# Patient Record
Sex: Female | Born: 1955 | Race: White | Hispanic: No | Marital: Married | Smoking: Never smoker
Health system: Southern US, Community
[De-identification: ages and names within clinical notes are randomized; demographics above are authoritative.]

## PROBLEM LIST (undated history)

## (undated) DIAGNOSIS — T7840XA Allergy, unspecified, initial encounter: Secondary | ICD-10-CM

## (undated) DIAGNOSIS — M810 Age-related osteoporosis without current pathological fracture: Secondary | ICD-10-CM

## (undated) HISTORY — PX: ABDOMINAL HYSTERECTOMY: SHX81

## (undated) HISTORY — DX: Allergy, unspecified, initial encounter: T78.40XA

## (undated) HISTORY — DX: Age-related osteoporosis without current pathological fracture: M81.0

## (undated) HISTORY — PX: COSMETIC SURGERY: SHX468

---

## 1999-01-20 ENCOUNTER — Other Ambulatory Visit: Admission: RE | Admit: 1999-01-20 | Discharge: 1999-01-20 | Payer: Self-pay | Admitting: *Deleted

## 2009-06-07 LAB — HM COLONOSCOPY

## 2011-05-06 ENCOUNTER — Encounter: Payer: Self-pay | Admitting: Family Medicine

## 2011-05-06 ENCOUNTER — Ambulatory Visit (INDEPENDENT_AMBULATORY_CARE_PROVIDER_SITE_OTHER): Payer: BC Managed Care – PPO | Admitting: Family Medicine

## 2011-05-06 DIAGNOSIS — G43909 Migraine, unspecified, not intractable, without status migrainosus: Secondary | ICD-10-CM | POA: Insufficient documentation

## 2011-05-06 MED ORDER — SUMATRIPTAN SUCCINATE 100 MG PO TABS: ORAL_TABLET | ORAL | Status: DC

## 2011-05-06 NOTE — Assessment & Plan Note (Signed)
Pt's sxs not consistent w/ cluster HA but are typical of migraine.  Pt has had relief from imitrex- will give script w/ instructions on use.  Reviewed supportive care and red flags that should prompt return.  Pt expressed understanding and is in agreement w/ plan.

## 2011-05-06 NOTE — Progress Notes (Signed)
  Subjective:    Patient ID: Sandra Mora, female    DOB: 1956/04/07, 55 y.o.   MRN: [id number]  HPI HAs- pt reports she has had HAs her 'whole life'.  She always thought they were allergy related.  When she was pregnant she had true migraines w/ auras but these weren't tx'd due to pregnancy.  After pregnancy took imitrex prn.  Had few problems since moving from [place].  Since January has had 3 episodes of 4 day HAs 'it's always 4 days'.  'nothing touches it'.  Took a friend's imitrex w/ relief.  sxs are unilateral, has associated nausea, photophobia.  No phonophobia.  Denies nasal congestion, red, runny nose, eye tearing.   Review of Systems For ROS see HPI     Objective:   Physical Exam  Constitutional: She is oriented to person, place, and time. She appears well-developed and well-nourished. No distress.  HENT:  Head: Normocephalic and atraumatic.  Eyes: Conjunctivae and EOM are normal.       R pupil doesn't constrict appropriately (old problem per pt)  Neck: Normal range of motion. Neck supple. No thyromegaly present.  Cardiovascular: Normal rate, regular rhythm, normal heart sounds and intact distal pulses.   No murmur heard. Pulmonary/Chest: Effort normal and breath sounds normal. No respiratory distress. She has no wheezes.  Lymphadenopathy:    She has no cervical adenopathy.  Neurological: She is alert and oriented to person, place, and time. She has normal reflexes. No cranial nerve deficit. Coordination normal.          Assessment & Plan:

## 2011-05-06 NOTE — Patient Instructions (Signed)
Your symptoms sound like a migraine Take the Imitrex (1/2 tab) at onset of migraine.  May repeat in 2 hrs if needed.  No more than 2 tabs in 24 hrs. Make sure you stay well hydrated Call with any questions or concerns Hang in there!

## 2012-05-12 ENCOUNTER — Other Ambulatory Visit: Payer: Self-pay | Admitting: Family Medicine

## 2012-05-12 NOTE — Telephone Encounter (Signed)
Pt left msg on triage vmail requesting a refill for sumatriptan 100mg .

## 2012-05-12 NOTE — Telephone Encounter (Signed)
refill Sumatriptan succ 100mg  tablet #15, wt-2-refills Take at onset of migraine. May repeat in 2-hours as needed Last fill 6.13.12, last ov 6.13.12

## 2012-05-13 MED ORDER — SUMATRIPTAN SUCCINATE 100 MG PO TABS: ORAL_TABLET | ORAL | Status: DC

## 2012-05-13 NOTE — Telephone Encounter (Signed)
Ok for #15, needs to schedule appt

## 2012-05-13 NOTE — Telephone Encounter (Signed)
Noted pt has not been seen in a year, please advise if the pt can have refll per medication

## 2012-05-13 NOTE — Telephone Encounter (Signed)
rx sent to pharmacy by e-script Letter has been mailed to pt address noted in the chart to advise they are overdue for cpe/ov/labs and the pt needs to contact office to set up appt   

## 2012-05-23 ENCOUNTER — Telehealth: Payer: Self-pay | Admitting: *Deleted

## 2012-05-23 NOTE — Telephone Encounter (Signed)
Pt left vm stating that [facility] T[facility] requested her to have a CPE with labs per noted a letter she received per MD Tabori sent note to this nurse per refill request on 05-12-12 to advise pt needs apt, pt vm noted that she had a CPE with labs drawn at her GYN office as well as a Bone Density test set up, pt wanted to advise so she will not have to come into [facility] office for a CPE now and we can contact the GYN office at [phone] to get a copy of the labs, please advise

## 2012-05-23 NOTE — Telephone Encounter (Signed)
Yes, we will need a copy of her labs and unless the GYN listened to heart/lungs and did complete physical exam she will still need to come in.

## 2012-05-23 NOTE — Telephone Encounter (Signed)
Called pt at home however the number is disconnected, called spouse whom advised pt mobile number [phone], added number to chart, called pt to advise [facility] instructions, pt not sure if the GYN listened to her heart or not, advised to the pt that CPE is more than just labs, and that [facility] protocol to have a EKG on file for each pt over 76yrs old for a base line to go by, pt understood and will have the GYN office fax her labs to our office, pt requested a CPE later in the afternoon per helps with the school system and earliest CPE noted after school starts, pt then accepted CPE apt 07-27-12 at 3:30pm, pt understood

## 2012-06-09 ENCOUNTER — Telehealth: Payer: Self-pay | Admitting: *Deleted

## 2012-06-09 NOTE — Telephone Encounter (Signed)
Noted incoming fax stating pt had recent pap on 04-04-12, noted already updated in chart, sent to be scanned into chart

## 2012-07-27 ENCOUNTER — Encounter: Payer: Self-pay | Admitting: Family Medicine

## 2012-07-27 ENCOUNTER — Ambulatory Visit (INDEPENDENT_AMBULATORY_CARE_PROVIDER_SITE_OTHER): Payer: BC Managed Care – PPO | Admitting: Family Medicine

## 2012-07-27 VITALS — BP 121/73 | HR 86 | Temp 98.4°F | Ht 61.25 in | Wt 133.2 lb

## 2012-07-27 DIAGNOSIS — Z0000 Encounter for general adult medical examination without abnormal findings: Secondary | ICD-10-CM

## 2012-07-27 MED ORDER — SUMATRIPTAN SUCCINATE 100 MG PO TABS: ORAL_TABLET | ORAL | Status: DC

## 2012-07-27 NOTE — Progress Notes (Signed)
  Subjective:    Patient ID: Sandra Mora, female    DOB: 1956/02/27, 56 y.o.   MRN: [id number]  HPI CPE- [facility], UTD on colonoscopy, mammo, DEXA, pap.  No concerns today.   Review of Systems Patient reports no vision/ hearing changes, adenopathy,fever, weight change,  persistant/recurrent hoarseness , swallowing issues, chest pain, palpitations, edema, persistant/recurrent cough, hemoptysis, dyspnea (rest/exertional/paroxysmal nocturnal), gastrointestinal bleeding (melena, rectal bleeding), abdominal pain, significant heartburn, bowel changes, GU symptoms (dysuria, hematuria, incontinence), Gyn symptoms (abnormal  bleeding, pain),  syncope, focal weakness, memory loss, numbness & tingling, skin/hair/nail changes, abnormal bruising or bleeding, anxiety, or depression.     Objective:   Physical Exam General Appearance:    Alert, cooperative, no distress, appears stated age  Head:    Normocephalic, without obvious abnormality, atraumatic  Eyes:    PERRL, conjunctiva/corneas clear, EOM's intact, fundi    benign, both eyes  Ears:    Normal TM's and external ear canals, both ears  Nose:   Nares normal, septum midline, mucosa normal, no drainage    or sinus tenderness  Throat:   Lips, mucosa, and tongue normal; teeth and gums normal  Neck:   Supple, symmetrical, trachea midline, no adenopathy;    Thyroid: no enlargement/tenderness/nodules  Back:     Symmetric, no curvature, ROM normal, no CVA tenderness  Lungs:     Clear to auscultation bilaterally, respirations unlabored  Chest Wall:    No tenderness or deformity   Heart:    Regular rate and rhythm, S1 and S2 normal, no murmur, rub   or gallop  Breast Exam:    Deferred to GYN  Abdomen:     Soft, non-tender, bowel sounds active all four quadrants,    no masses, no organomegaly  Genitalia:    Deferred to GYN  Rectal:    Extremities:   Extremities normal, atraumatic, no cyanosis or edema  Pulses:   2+ and symmetric all  extremities  Skin:   Skin color, texture, turgor normal, no rashes or lesions  Lymph nodes:   Cervical, supraclavicular, and axillary nodes normal  Neurologic:   CNII-XII intact, normal strength, sensation and reflexes    throughout          Assessment & Plan:

## 2012-07-27 NOTE — Assessment & Plan Note (Signed)
Pt's CPE WNL.  UTD on health maintenance.  Reviewed labs done at GYN.  Anticipatory guidance provided.

## 2012-07-27 NOTE — Patient Instructions (Addendum)
Follow up in 1 year or as needed Keep up the good work!  You look great! Call with any questions or concerns Happy Early Birthday!! 

## 2012-08-26 ENCOUNTER — Other Ambulatory Visit: Payer: Self-pay

## 2012-08-26 MED ORDER — SUMATRIPTAN SUCCINATE 100 MG PO TABS: ORAL_TABLET | ORAL | Status: DC

## 2013-07-18 ENCOUNTER — Telehealth: Payer: Self-pay | Admitting: *Deleted

## 2013-07-18 NOTE — Telephone Encounter (Signed)
Prior Auth was needed for sumatriptan however patients insurance would only approve the medication for # 9 per month.  If there is a need for an appeal the case no# is [id number].  Ag cma

## 2014-07-18 ENCOUNTER — Other Ambulatory Visit: Payer: Self-pay | Admitting: Family Medicine

## 2014-07-18 NOTE — Telephone Encounter (Signed)
Med filled.  

## 2014-08-06 ENCOUNTER — Encounter: Payer: Self-pay | Admitting: Medical

## 2014-08-06 ENCOUNTER — Ambulatory Visit (INDEPENDENT_AMBULATORY_CARE_PROVIDER_SITE_OTHER): Payer: BC Managed Care – PPO | Admitting: Medical

## 2014-08-06 ENCOUNTER — Telehealth: Payer: Self-pay

## 2014-08-06 ENCOUNTER — Ambulatory Visit: Payer: BC Managed Care – PPO | Admitting: Physician Assistant

## 2014-08-06 VITALS — BP 132/80 | HR 72 | Temp 98.5°F | Ht 62.0 in | Wt 136.0 lb

## 2014-08-06 DIAGNOSIS — J309 Allergic rhinitis, unspecified: Secondary | ICD-10-CM

## 2014-08-06 DIAGNOSIS — T148 Other injury of unspecified body region: Secondary | ICD-10-CM

## 2014-08-06 DIAGNOSIS — W57XXXA Bitten or stung by nonvenomous insect and other nonvenomous arthropods, initial encounter: Secondary | ICD-10-CM | POA: Insufficient documentation

## 2014-08-06 DIAGNOSIS — J209 Acute bronchitis, unspecified: Secondary | ICD-10-CM

## 2014-08-06 MED ORDER — ALBUTEROL SULFATE HFA 108 (90 BASE) MCG/ACT IN AERS: 2.0000 | INHALATION_SPRAY | Freq: Four times a day (QID) | RESPIRATORY_TRACT | Status: DC | PRN

## 2014-08-06 MED ORDER — FLUCONAZOLE 150 MG PO TABS: 150.0000 mg | ORAL_TABLET | Freq: Once | ORAL | Status: DC

## 2014-08-06 MED ORDER — BENZONATATE 100 MG PO CAPS: 100.0000 mg | ORAL_CAPSULE | Freq: Three times a day (TID) | ORAL | Status: DC | PRN

## 2014-08-06 MED ORDER — DOXYCYCLINE HYCLATE 100 MG PO TABS: 100.0000 mg | ORAL_TABLET | Freq: Two times a day (BID) | ORAL | Status: DC

## 2014-08-06 MED ORDER — SUMATRIPTAN SUCCINATE 50 MG PO TABS: 50.0000 mg | ORAL_TABLET | ORAL | Status: DC | PRN

## 2014-08-06 NOTE — Assessment & Plan Note (Signed)
All of patient's symptoms seem to have started off as mostly allergic. I advised her to get an over-the-counter antihistamine and to continue her steroid nasal spray.

## 2014-08-06 NOTE — Assessment & Plan Note (Signed)
Patient's last migraine was 3 weeks ago. Describes classic type and no worrisome features. She mention this at the end of the interview. She requests refill of Imitrex. So I sent in a prescription.

## 2014-08-06 NOTE — Telephone Encounter (Signed)
Called in Imitrex 50 mg 1 tablet at onset of headache. Repeat in 2 hours if headache persists. 2 tablet maximum in a 24 hour period #10 with 0 refills. To [facility] [place], [place]. [address] Per E. Jamesetta Orleans

## 2014-08-06 NOTE — Patient Instructions (Addendum)
You appear to have had some allergic rhinitis initially. I want you to take antihistamine otc and continue your steroid nasal spray. With your duration of illness lasting 2 wks you may now have some bronchitis as well so I am prescribing doxycycline antibiotic. This antibiotic  would be treatment for rmsf and lyme since you report 2 tick bites in past 3 weeks. If you get any wheezing I am making proair inhaler available. Follow up in 7 days any persisting symptoms or as needed.  At the end of exam as leaving she requested refill of her imitrix.

## 2014-08-06 NOTE — Progress Notes (Signed)
   Subjective:    Patient ID: Sandra Mora, female    DOB: 1956-10-05, 58 y.o.   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 She states about 2 wks when her symptoms started. Started as st and nasal congestion. Hx of allergic rhinitis in the past. Pt states gradually feeling worse and occasionally bring up some mucous. Feeing some fatigue. Pt denies any sinus congestion. Feeling little more winded today than usual. But no wheezing. On and off chills and warmth..   Then also states also picked 2 ticks off her. 1st tick was 3 wks ago. 2nds tick was 2 wks ago.   Review of Systems  Constitutional: Positive for fever, chills and fatigue.       Mild fevers, and mild chills. With some fatigue.  HENT: Positive for congestion, postnasal drip, rhinorrhea and sinus pressure. Negative for nosebleeds, sneezing, sore throat, trouble swallowing and voice change.   Respiratory: Positive for cough, shortness of breath and wheezing. Negative for chest tightness.   Cardiovascular: Negative for chest pain and palpitations.  Gastrointestinal: Negative for nausea, vomiting, abdominal pain, diarrhea, constipation, anal bleeding and rectal pain.  Genitourinary: Negative.   Musculoskeletal: Negative for back pain.  Skin:       One on back of her lt knee. And other right medial thigh.  Neurological: Negative for dizziness, seizures, syncope, speech difficulty, weakness, light-headedness, numbness and headaches.  Hematological: Negative for adenopathy. Does not bruise/bleed easily.       Objective:   Physical Exam  General  Mental Status - Alert. General Appearance - Well groomed. Not in acute distress.  Skin Rashes- No Rashes.  HEENT Head- Normal. Ear Auditory Canal - Left- Normal. Right - Normal.Tympanic Membrane- Left- Normal. Right- Normal. Eye Sclera/Conjunctiva- Left- Normal. Right- Normal. Nose & Sinuses Nasal Mucosa- Left-  Boggy + Congested. Right-  Boggy + Congested. No maxillary sinus pressure or frontal sinus pressure presently. Mouth & Throat Lips: Upper Lip- Normal: no dryness, cracking, pallor, cyanosis, or vesicular eruption. Lower Lip-Normal: no dryness, cracking, pallor, cyanosis or vesicular eruption. Buccal Mucosa- Bilateral- No Aphthous ulcers. Oropharynx- No Discharge or Erythema. Tonsils: Characteristics- Bilateral- No Erythema or Congestion. Size/Enlargement- Bilateral- No enlargement. Discharge- bilateral-None.  Neck Neck- Supple. No Masses.   Chest and Lung Exam Auscultation: Breath Sounds:-Normal, clear even and unlabored  Cardiovascular Auscultation:Rythm- Regular, rate and rhythm.  Murmurs & Other Heart Sounds:Ausculatation of the heart reveal- No Murmurs.  Lymphatic Head & Neck General Head & Neck Lymphatics: Bilateral: Description- No Localized lymphadenopathy.         Assessment & Plan:

## 2014-08-06 NOTE — Assessment & Plan Note (Signed)
2 tick bites within a three-week period. Patient declines any laboratory workup for 2 bites. She is agreeable to starting the doxycycline.

## 2014-08-06 NOTE — Assessment & Plan Note (Addendum)
After 2 weeks she appears to now have possible bronchitis as she is describing some productive cough. In addition she has a history of the tick bites so doxycycline may be beneficial for both bronchitis and the tick bites. So I did prescribe her antibiotic.  Note today she reported some mild shortness of breath on climbing stairs. So I did call in an inhaler. Note on physical exam today she did not have any Homan's sign.

## 2015-03-24 ENCOUNTER — Other Ambulatory Visit: Payer: Self-pay | Admitting: Medical

## 2015-03-25 NOTE — Telephone Encounter (Signed)
Called patient left message for call back.  

## 2015-08-24 LAB — HM PAP SMEAR

## 2015-08-24 LAB — HM MAMMOGRAPHY: HM Mammogram: NORMAL (ref 0–4)

## 2015-09-11 ENCOUNTER — Other Ambulatory Visit (HOSPITAL_COMMUNITY): Payer: Self-pay | Admitting: *Deleted

## 2015-09-12 ENCOUNTER — Encounter (HOSPITAL_COMMUNITY)
Admission: RE | Admit: 2015-09-12 | Discharge: 2015-09-12 | Disposition: A | Discharge status: Home / Self Care | Payer: BC Managed Care – PPO | Source: Ambulatory Visit | Attending: Obstetrics and Gynecology | Admitting: Obstetrics and Gynecology

## 2015-09-12 DIAGNOSIS — M810 Age-related osteoporosis without current pathological fracture: Secondary | ICD-10-CM | POA: Diagnosis not present

## 2015-09-12 MED ORDER — ZOLEDRONIC ACID 5 MG/100ML IV SOLN
INTRAVENOUS | Status: AC
  Filled 2015-09-12: qty 100

## 2015-09-12 MED ORDER — ZOLEDRONIC ACID 5 MG/100ML IV SOLN
5.0000 mg | Freq: Once | INTRAVENOUS | Status: AC
  Administered 2015-09-12: 5 mg via INTRAVENOUS

## 2015-09-12 NOTE — Discharge Instructions (Signed)

## 2015-09-19 ENCOUNTER — Other Ambulatory Visit: Payer: Self-pay | Admitting: Medical

## 2015-10-28 ENCOUNTER — Emergency Department (HOSPITAL_BASED_OUTPATIENT_CLINIC_OR_DEPARTMENT_OTHER)
Admission: EM | Admit: 2015-10-28 | Discharge: 2015-10-29 | Disposition: A | Discharge status: Home / Self Care | Payer: BC Managed Care – PPO | Attending: Emergency Medicine | Admitting: Emergency Medicine

## 2015-10-28 ENCOUNTER — Emergency Department (HOSPITAL_BASED_OUTPATIENT_CLINIC_OR_DEPARTMENT_OTHER): Payer: BC Managed Care – PPO

## 2015-10-28 ENCOUNTER — Encounter (HOSPITAL_BASED_OUTPATIENT_CLINIC_OR_DEPARTMENT_OTHER): Payer: Self-pay | Admitting: *Deleted

## 2015-10-28 DIAGNOSIS — W108XXA Fall (on) (from) other stairs and steps, initial encounter: Secondary | ICD-10-CM | POA: Insufficient documentation

## 2015-10-28 DIAGNOSIS — Z792 Long term (current) use of antibiotics: Secondary | ICD-10-CM | POA: Insufficient documentation

## 2015-10-28 DIAGNOSIS — S82842A Displaced bimalleolar fracture of left lower leg, initial encounter for closed fracture: Secondary | ICD-10-CM

## 2015-10-28 DIAGNOSIS — Z79899 Other long term (current) drug therapy: Secondary | ICD-10-CM | POA: Diagnosis not present

## 2015-10-28 DIAGNOSIS — Y9389 Activity, other specified: Secondary | ICD-10-CM | POA: Diagnosis not present

## 2015-10-28 DIAGNOSIS — Y9289 Other specified places as the place of occurrence of the external cause: Secondary | ICD-10-CM | POA: Diagnosis not present

## 2015-10-28 DIAGNOSIS — G43909 Migraine, unspecified, not intractable, without status migrainosus: Secondary | ICD-10-CM | POA: Diagnosis not present

## 2015-10-28 DIAGNOSIS — Y998 Other external cause status: Secondary | ICD-10-CM | POA: Diagnosis not present

## 2015-10-28 DIAGNOSIS — S9031XA Contusion of right foot, initial encounter: Secondary | ICD-10-CM

## 2015-10-28 DIAGNOSIS — Z7982 Long term (current) use of aspirin: Secondary | ICD-10-CM | POA: Diagnosis not present

## 2015-10-28 DIAGNOSIS — S82832A Other fracture of upper and lower end of left fibula, initial encounter for closed fracture: Secondary | ICD-10-CM | POA: Diagnosis not present

## 2015-10-28 DIAGNOSIS — S99921A Unspecified injury of right foot, initial encounter: Secondary | ICD-10-CM | POA: Diagnosis present

## 2015-10-28 MED ORDER — HYDROCODONE-ACETAMINOPHEN 5-325 MG PO TABS: 1.0000 | ORAL_TABLET | Freq: Four times a day (QID) | ORAL | Status: DC | PRN

## 2015-10-28 MED ORDER — HYDROCODONE-ACETAMINOPHEN 5-325 MG PO TABS
1.0000 | ORAL_TABLET | Freq: Once | ORAL | Status: AC
  Administered 2015-10-28: 1 via ORAL
  Filled 2015-10-28: qty 1

## 2015-10-28 NOTE — Discharge Instructions (Signed)
Ankle Fracture A fracture is a break in a bone. The ankle joint is made up of three bones. These include the lower (distal)sections of your lower leg bones, called the tibia and fibula, along with a bone in your foot, called the talus. Depending on how bad the break is and if more than one ankle joint bone is broken, a cast or splint is used to protect and keep your injured bone from moving while it heals. Sometimes, surgery is required to help the fracture heal properly.  There are two general types of fractures:  Stable fracture. This includes a single fracture line through one bone, with no injury to ankle ligaments. A fracture of the talus that does not have any displacement (movement of the bone on either side of the fracture line) is also stable.  Unstable fracture. This includes more than one fracture line through one or more bones in the ankle joint. It also includes fractures that have displacement of the bone on either side of the fracture line. CAUSES  A direct blow to the ankle.   Quickly and severely twisting your ankle.  Trauma, such as a car accident or falling from a significant height. RISK FACTORS You may be at a higher risk of ankle fracture if:  You have certain medical conditions.  You are involved in high-impact sports.  You are involved in a high-impact car accident. SIGNS AND SYMPTOMS   Tender and swollen ankle.  Bruising around the injured ankle.  Pain on movement of the ankle.  Difficulty walking or putting weight on the ankle.  A cold foot below the site of the ankle injury. This can occur if the blood vessels passing through your injured ankle were also damaged.  Numbness in the foot below the site of the ankle injury. DIAGNOSIS  An ankle fracture is usually diagnosed with a physical exam and X-rays. A CT scan may also be required for complex fractures. TREATMENT  Stable fractures are treated with a cast or splint and using crutches to avoid putting  weight on your injured ankle. This is followed by an ankle strengthening program. Some patients require a special type of cast, depending on other medical problems they may have. Unstable fractures require surgery to ensure the bones heal properly. Your health care provider will tell you what type of fracture you have and the best treatment for your condition. HOME CARE INSTRUCTIONS   Review correct crutch use with your health care provider and use your crutches as directed. Safe use of crutches is extremely important. Misuse of crutches can cause you to fall or cause injury to nerves in your hands or armpits.  Do not put weight or pressure on the injured ankle until directed by your health care provider.  To lessen the swelling, keep the injured leg elevated while sitting or lying down.  Apply ice to the injured area:  Put ice in a plastic bag.  Place a towel between your cast and the bag.  Leave the ice on for 20 minutes, 2-3 times a day.  If you have a plaster or fiberglass cast:  Do not try to scratch the skin under the cast with any objects. This can increase your risk of skin infection.  Check the skin around the cast every day. You may put lotion on any red or sore areas.  Keep your cast dry and clean.  If you have a plaster splint:  Wear the splint as directed.  You may loosen the elastic   around the splint if your toes become numb, tingle, or turn cold or blue.  Do not put pressure on any part of your cast or splint; it may break. Rest your cast only on a pillow the first 24 hours until it is fully hardened.  Your cast or splint can be protected during bathing with a plastic bag sealed to your skin with medical tape. Do not lower the cast or splint into water.  Take medicines as directed by your health care provider. Only take over-the-counter or prescription medicines for pain, discomfort, or fever as directed by your health care provider.  Do not drive a vehicle until  your health care provider specifically tells you it is safe to do so.  If your health care provider has given you a follow-up appointment, it is very important to keep that appointment. Not keeping the appointment could result in a chronic or permanent injury, pain, and disability. If you have any problem keeping the appointment, call the facility for assistance. SEEK MEDICAL CARE IF: You develop increased swelling or discomfort. SEEK IMMEDIATE MEDICAL CARE IF:   Your cast gets damaged or breaks.  You have continued severe pain.  You develop new pain or swelling after the cast was put on.  Your skin or toenails below the injury turn blue or gray.  Your skin or toenails below the injury feel cold, numb, or have loss of sensitivity to touch.  There is a bad smell or pus draining from under the cast. MAKE SURE YOU:   Understand these instructions.  Will watch your condition.  Will get help right away if you are not doing well or get worse.   This information is not intended to replace advice given to you by your health care provider. Make sure you discuss any questions you have with your health care provider.   Document Released: 11/06/2000 Document Revised: 11/14/2013 Document Reviewed: 06/08/2013 Elsevier Interactive Patient Education 2016 [employer]  

## 2015-10-28 NOTE — ED Notes (Signed)
She slipped on the last 3 steps in her house. Injury to both ankles.

## 2015-10-28 NOTE — ED Notes (Signed)
I fit and adjusted patient's own crutches and completed crutch training.

## 2015-10-28 NOTE — ED Notes (Signed)
Husband to drive patient home.

## 2015-10-28 NOTE — ED Provider Notes (Signed)
CSN: [id number]     Arrival date & time 10/28/15  2021 History   First MD Initiated Contact with Patient 10/28/15 2259     Chief Complaint  Patient presents with  . Ankle Injury     (Consider location/radiation/quality/duration/timing/severity/associated sxs/prior Treatment) HPI  This is a 59 year old female who fell down several stairs at home earlier this evening. She attributes the fall to glasses which caused visual distortion. She is having moderate pain in the left lateral ankle with associated crepitus. Pain is worse with movement or weightbearing. She has bruising to the dorsal right foot but without significant pain or tenderness. She took 600 milligrams of Advil prior to arrival. She denies other injury.  Past Medical History  Diagnosis Date  . Allergy     seasonal  . Migraine    Past Surgical History  Procedure Laterality Date  . Cosmetic surgery     No family history on file. Social History  Substance Use Topics  . Smoking status: Never Smoker   . Smokeless tobacco: None  . Alcohol Use: No   OB History    No data available     Review of Systems  All other systems reviewed and are negative.   Allergies  Codeine  Home Medications   Prior to Admission medications   Medication Sig Start Date End Date Taking? Authorizing Provider  albuterol (PROVENTIL HFA;VENTOLIN HFA) 108 (90 BASE) MCG/ACT inhaler Inhale 2 puffs into the lungs every 6 (six) hours as needed for wheezing or shortness of breath. 08/06/14   Esperanza RichtersEdward Saguier, PA-C  Aspirin (ANACIN PO) Take by mouth as needed.      Historical Provider, MD  Aspirin-Salicylamide-Caffeine (BC HEADACHE POWDER PO) Take by mouth as needed.      Historical Provider, MD  b complex vitamins tablet Take 1 tablet by mouth daily.      Historical Provider, MD  benzonatate (TESSALON) 100 MG capsule Take 1 capsule (100 mg total) by mouth 3 (three) times daily as needed for cough. 08/06/14   Ramon DredgeEdward Saguier, PA-C  Calcium  Carbonate-Vitamin D (CALCIUM 600 + D PO) Take by mouth daily.      Historical Provider, MD  Cholecalciferol (VITAMIN D) 2000 UNITS CAPS Take by mouth daily.      Historical Provider, MD  Conj Estrog-Medroxyprogest Ace (PREMPRO PO) Take by mouth daily.      Historical Provider, MD  doxycycline (VIBRA-TABS) 100 MG tablet Take 1 tablet (100 mg total) by mouth 2 (two) times daily. 08/06/14   Ramon DredgeEdward Saguier, PA-C  fluconazole (DIFLUCAN) 150 MG tablet Take 1 tablet (150 mg total) by mouth once. 08/06/14   Ramon DredgeEdward Saguier, PA-C  loratadine (CLARITIN) 10 MG tablet Take 10 mg by mouth daily.      Historical Provider, MD  magnesium oxide (MAG-OX) 400 MG tablet Take 400 mg by mouth daily.    Historical Provider, MD  Multiple Vitamin (MULTIVITAMIN) tablet Take 1 tablet by mouth daily.      Historical Provider, MD  Naproxen Sodium (ALEVE PO) Take by mouth as needed.      Historical Provider, MD  Omega-3 Fatty Acids (FISH OIL PO) Take by mouth daily.      Historical Provider, MD  Probiotic Product (PROBIOTIC DAILY PO) Take by mouth.    Historical Provider, MD  Pseudoephedrine HCl (SUDAFED PO) Take by mouth daily as needed.      Historical Provider, MD  SUMAtriptan (IMITREX) 50 MG tablet TAKE 1 TABLET AT ONSET OF HEADACHE, REPEAT IN 2  HRS IF HEADACHE PERSISTS **MAX 2 TAB IN 24 HRS** 09/19/15   Esperanza Richters, PA-C  VITAMIN K PO Take by mouth.    Historical Provider, MD   BP 149/75 mmHg  Pulse 85  Temp(Src) 98.2 F (36.8 C) (Oral)  Resp 18  Ht  (1.575 m)  Wt 130 lb (58.968 kg)  BMI 23.77 kg/m2  SpO2 100%   Physical Exam  General: Well-developed, well-nourished female in no acute distress; appearance consistent with age of record HENT: normocephalic; atraumatic Eyes: Normal appearance Neck: supple; no C-spine tenderness Heart: regular rate and rhythm Lungs: clear to auscultation bilaterally Abdomen: soft; nondistended Extremities: No deformity; full range of motion except left ankle; pulses normal;  tenderness and ecchymosis over left distal fibula; ecchymosis overlying the bases of the right fourth and fifth metatarsals without significant tenderness, right ankle joint is stable and nontender Neurologic: Awake, alert and oriented; motor function intact in all extremities and symmetric; no facial droop Skin: Warm and dry Psychiatric: Normal mood and affect    ED Course  Procedures (including critical care time)   MDM  Nursing notes and vitals signs, including pulse oximetry, reviewed.  Summary of this visit's results, reviewed by myself:  Imaging Studies: Dg Ankle Complete Left  11/03/15  CLINICAL DATA:  Larey Seat down stairs tonight. Left ankle injury and pain. Initial encounter. EXAM: LEFT ANKLE COMPLETE - 3+ VIEW COMPARISON:  None. FINDINGS: A minimally displaced spiral fracture of the distal fibular diaphysis is seen. On the lateral projection, there is a possible fracture through the posterior malleolus of the distal tibia. No evidence of dislocation. IMPRESSION: Minimally displaced spiral fracture of distal fibular diaphysis. Question fracture through posterior malleolus of distal tibia. Consider ankle CT for further evaluation if clinically warranted. Electronically Signed   By: Myles Rosenthal M.D.   On: 11-03-15 20:58   Dg Ankle Complete Right  11-03-15  CLINICAL DATA:  Right ankle injury status post fall. EXAM: RIGHT ANKLE - COMPLETE 3+ VIEW COMPARISON:  None. FINDINGS: There is no evidence of fracture, dislocation, or joint effusion. There is no evidence of arthropathy or other focal bone abnormality. Soft tissues are unremarkable. IMPRESSION: Negative. Electronically Signed   By: Ted Mcalpine M.D.   On: 2015/11/03 20:57   There is no posterior tibial tenderness on the left. I do not believe a CT scan is warranted at this time.     Paula Libra, MD 2015-11-03 [phone]

## 2016-04-23 ENCOUNTER — Ambulatory Visit (INDEPENDENT_AMBULATORY_CARE_PROVIDER_SITE_OTHER): Payer: BC Managed Care – PPO | Admitting: Family Medicine

## 2016-04-23 ENCOUNTER — Encounter: Payer: Self-pay | Admitting: Family Medicine

## 2016-04-23 VITALS — BP 112/80 | HR 76 | Temp 98.2°F | Resp 16 | Ht 62.0 in | Wt 129.5 lb

## 2016-04-23 DIAGNOSIS — M810 Age-related osteoporosis without current pathological fracture: Secondary | ICD-10-CM | POA: Diagnosis not present

## 2016-04-23 MED ORDER — SUMATRIPTAN SUCCINATE 50 MG PO TABS: ORAL_TABLET | ORAL | Status: DC

## 2016-04-23 NOTE — Progress Notes (Signed)
Pre visit review using our clinic review tool, if applicable. No additional management support is needed unless otherwise documented below in the visit note. 

## 2016-04-23 NOTE — Assessment & Plan Note (Signed)
New to provider, ongoing for pt.  She had a terrible reaction to her Reclast injxn- flu like symptoms for 3 days.  She is not interested in ever doing this again.  Since she already has osteoporosis and has had a fracture, she is interested in Prolia's bone building effects.  Will start insurance verification process.  Pt to continue to take Calcium and Vit D daily

## 2016-04-23 NOTE — Progress Notes (Signed)
   Subjective:    Patient ID: Sandra GarnerNancy C Mora, female    DOB: [birth year], 60 y.o.   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  Pt reports DEXA in 2015 which again showed osteoporosis.  Pt has strong family hx.  Dr Renaldo FiddlerAdkins discussed tx options w/ pt and she had Reclast September 2016.  Pt reports 'it was the worst flu for 3 days'.  Pt had a fall in December and fx'd L ankle.  Pt is not interested in Bisphosphonate due to risk of GI upset   Review of Systems For ROS see HPI     Objective:   Physical Exam  Constitutional: She is oriented to person, place, and time. She appears well-developed and well-nourished. No distress.  HENT:  Head: Normocephalic and atraumatic.  Neurological: She is alert and oriented to person, place, and time.  Skin: Skin is warm and dry.  Psychiatric: She has a normal mood and affect. Her behavior is normal. Thought content normal.  Vitals reviewed.         Assessment & Plan:

## 2016-04-23 NOTE — Patient Instructions (Signed)
Schedule your complete physical in 6 months We'll call you once we have your insurance verified and we have the Prolia in stock to schedule your injection Continue to take calcium and Vit D daily Call with any questions or concerns Welcome Back!!!

## 2016-05-04 ENCOUNTER — Encounter: Payer: Self-pay | Admitting: General Practice

## 2016-05-20 ENCOUNTER — Telehealth: Payer: Self-pay | Admitting: General Practice

## 2016-05-20 NOTE — Telephone Encounter (Signed)
[facility] was [facility], but due to upcoming changes in her insurance it is only approved until 05/22/16. I called pt to verify if she still would like the Prolia injection and to get her scheduled before 05/22/16. LMOVM for pt to return my call.

## 2016-05-20 NOTE — Telephone Encounter (Signed)
Per pt , she would like to wait until July 1st to ensure there is no reaction before her daughter's wedding on July 8th.

## 2016-05-21 ENCOUNTER — Other Ambulatory Visit: Payer: Self-pay | Admitting: Medical

## 2016-06-10 NOTE — Telephone Encounter (Signed)
Prolia injection approved. This was ordered and pt was scheduled for 06/25/16 @ 1:45pm.

## 2016-06-12 IMAGING — DX DG ANKLE COMPLETE 3+V*R*
3 series · 3 of 3 positions shown · non-contrast
Comparison: None.

CLINICAL DATA: Right ankle injury status post fall.

EXAM:
RIGHT ANKLE - COMPLETE 3+ VIEW

[ankle ap]
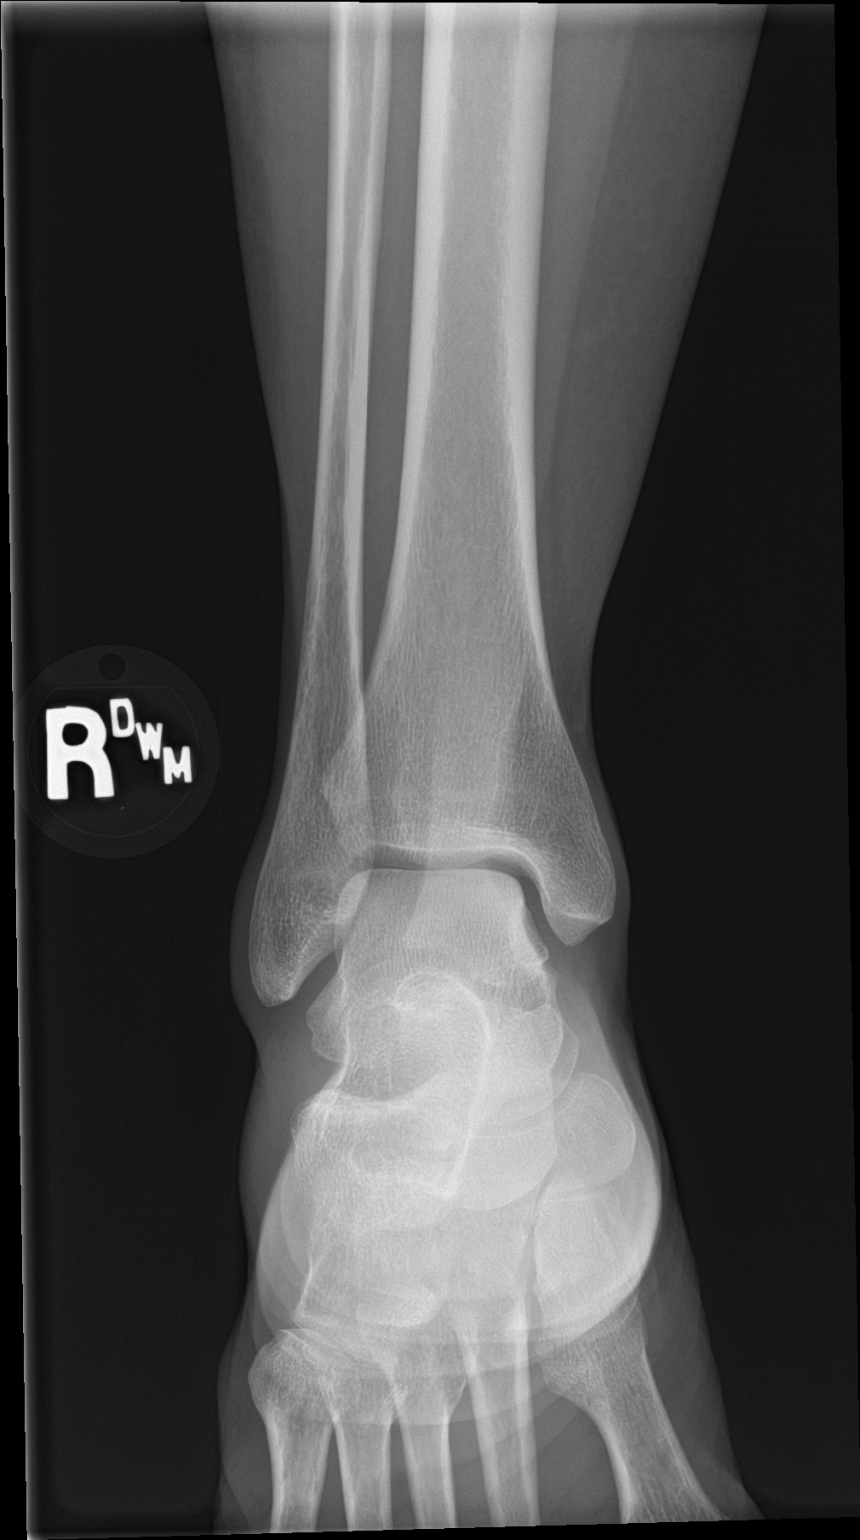

[ankle obl]
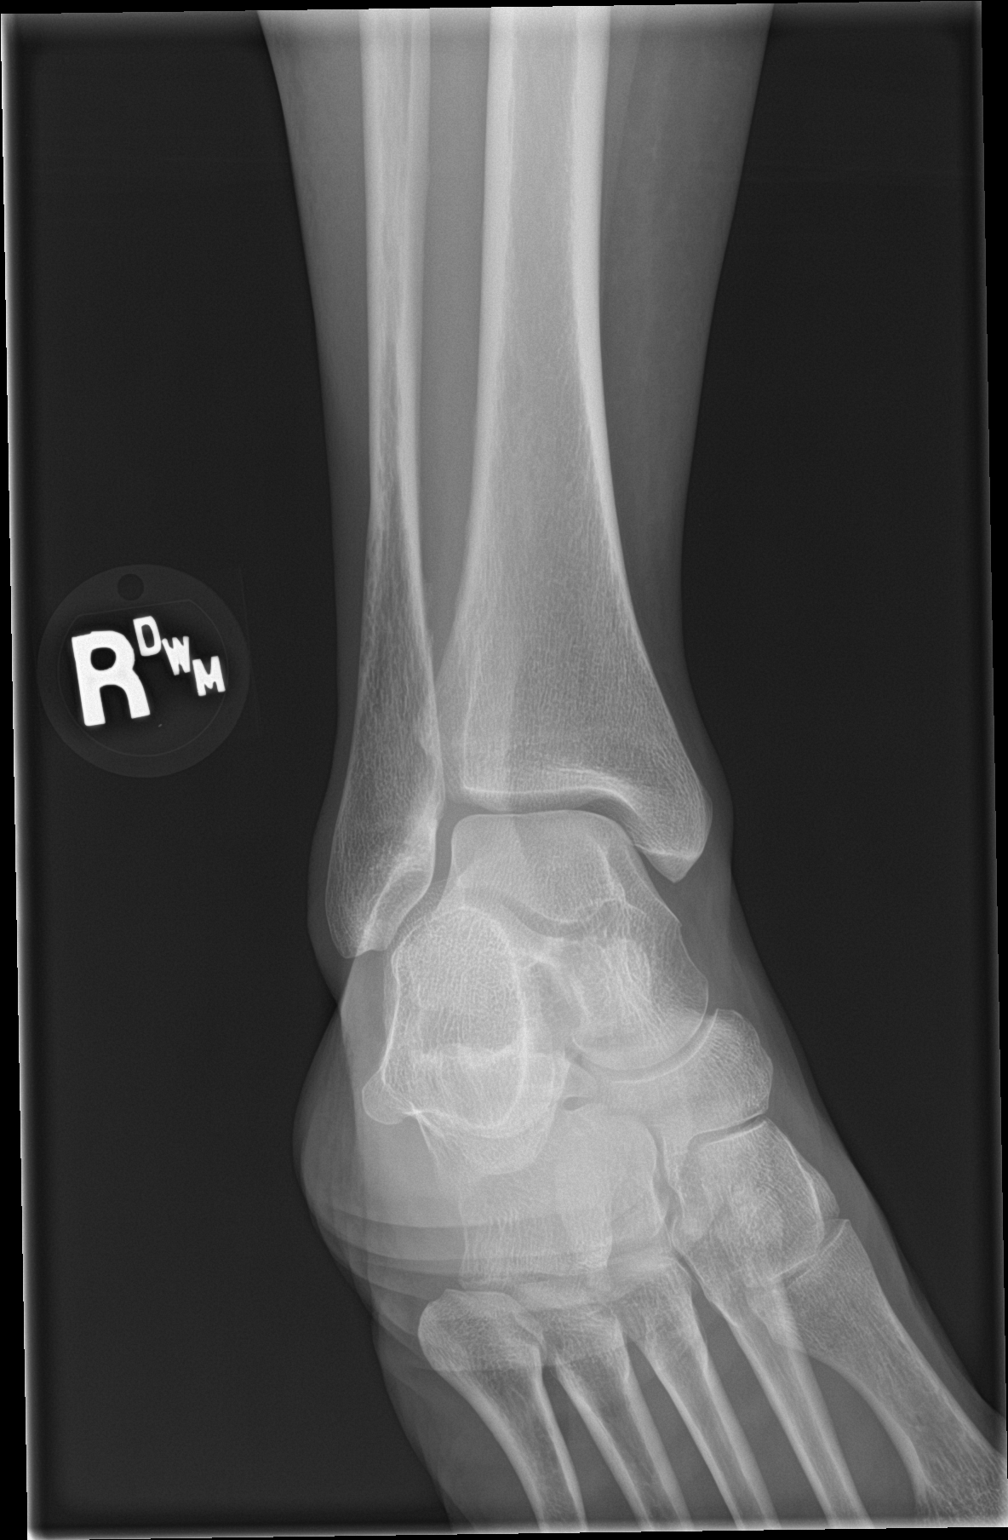

[ankle lat]
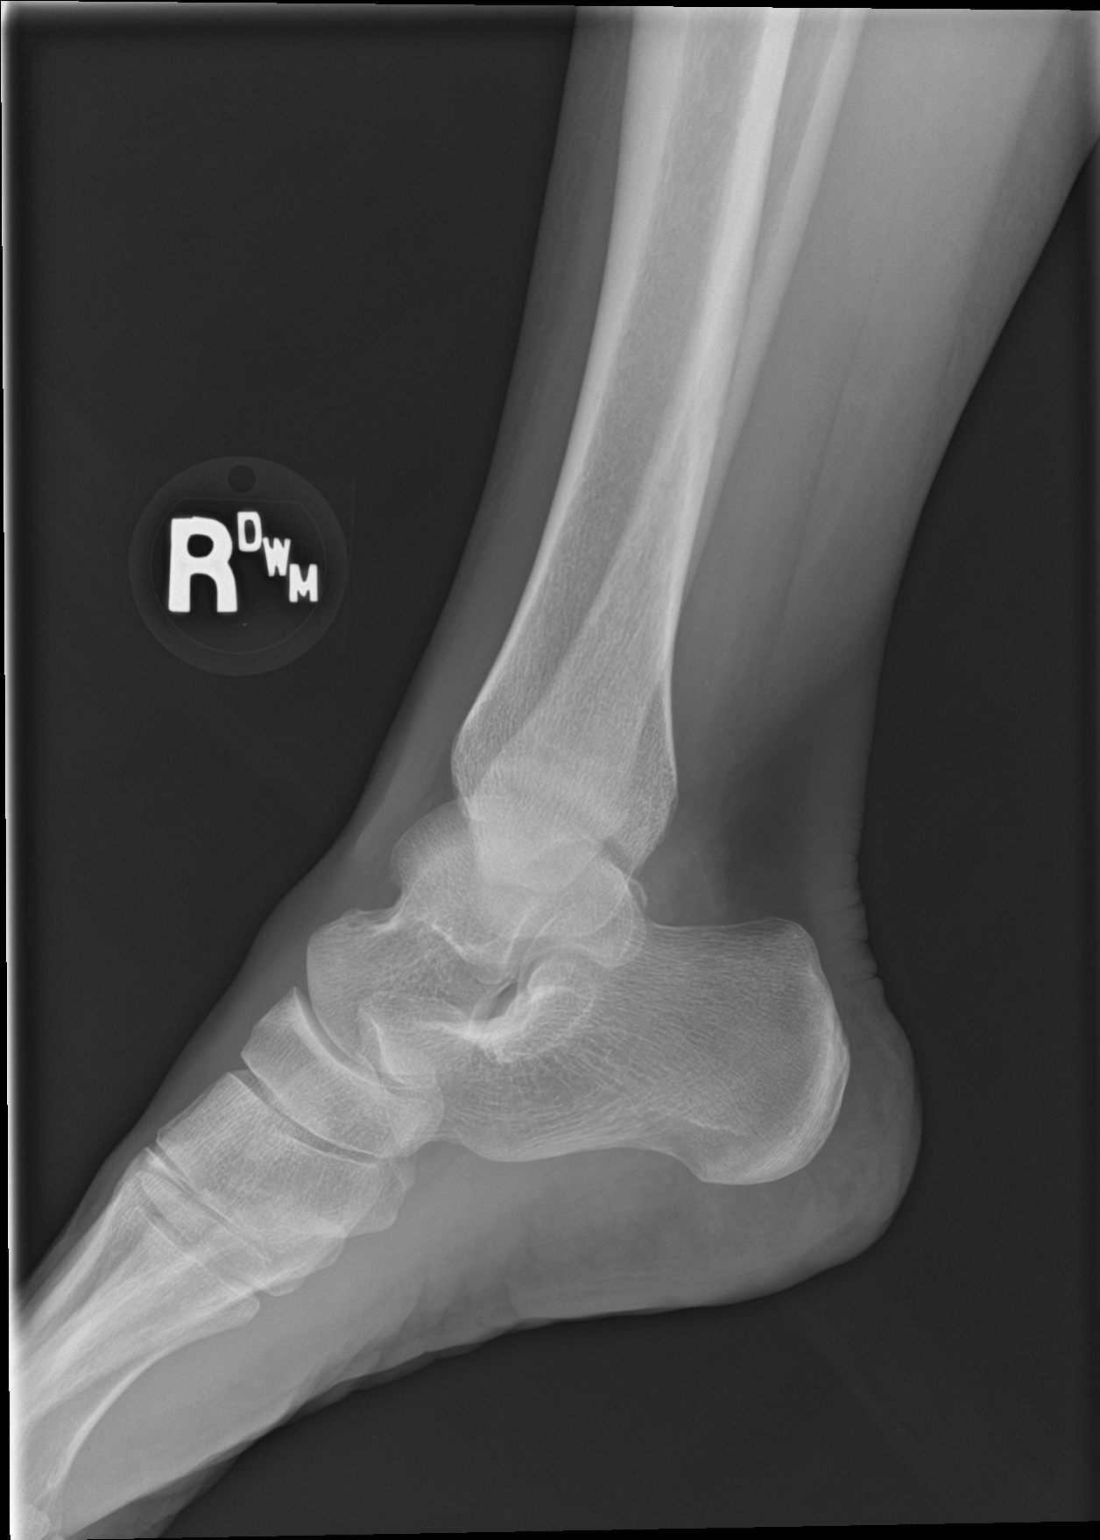

[3 of 3 positions shown; findings below may reference images not displayed]

FINDINGS: There is no evidence of fracture, dislocation, or joint effusion.
There is no evidence of arthropathy or other focal bone abnormality.
Soft tissues are unremarkable.
IMPRESSION: Negative.

## 2016-06-23 LAB — HM PAP SMEAR

## 2016-06-23 LAB — HM MAMMOGRAPHY: HM MAMMO: NORMAL (ref 0–4)

## 2016-06-23 LAB — HM DEXA SCAN

## 2016-06-25 ENCOUNTER — Ambulatory Visit (INDEPENDENT_AMBULATORY_CARE_PROVIDER_SITE_OTHER): Payer: BC Managed Care – PPO | Admitting: General Practice

## 2016-06-25 DIAGNOSIS — M810 Age-related osteoporosis without current pathological fracture: Secondary | ICD-10-CM | POA: Diagnosis not present

## 2016-06-25 MED ORDER — DENOSUMAB 60 MG/ML ~~LOC~~ SOLN
60.0000 mg | Freq: Once | SUBCUTANEOUS | Status: AC
  Administered 2016-06-25: 60 mg via SUBCUTANEOUS

## 2016-10-26 ENCOUNTER — Ambulatory Visit (INDEPENDENT_AMBULATORY_CARE_PROVIDER_SITE_OTHER): Payer: BC Managed Care – PPO | Admitting: Family Medicine

## 2016-10-26 ENCOUNTER — Encounter: Payer: Self-pay | Admitting: Family Medicine

## 2016-10-26 VITALS — BP 121/80 | HR 79 | Temp 98.0°F | Resp 16 | Ht 62.0 in | Wt 128.2 lb

## 2016-10-26 DIAGNOSIS — Z0000 Encounter for general adult medical examination without abnormal findings: Secondary | ICD-10-CM

## 2016-10-26 DIAGNOSIS — Z1211 Encounter for screening for malignant neoplasm of colon: Secondary | ICD-10-CM

## 2016-10-26 LAB — BASIC METABOLIC PANEL
BUN: 14 mg/dL (ref 6–23)
CALCIUM: 10.2 mg/dL (ref 8.4–10.5)
CO2: 31 mEq/L (ref 19–32)
Chloride: 104 mEq/L (ref 96–112)
Creatinine, Ser: 0.71 mg/dL (ref 0.40–1.20)
GFR: 89.19 mL/min (ref >60.00)
Glucose, Bld: 92 mg/dL (ref 70–99)
POTASSIUM: 4.3 meq/L (ref 3.5–5.1)
SODIUM: 142 meq/L (ref 135–145)

## 2016-10-26 LAB — CBC WITH DIFFERENTIAL/PLATELET
BASOS ABS: 0 10*3/uL (ref 0.0–0.1)
Basophils Relative: 0.5 % (ref 0.0–3.0)
EOS PCT: 1.1 % (ref 0.0–5.0)
Eosinophils Absolute: 0.1 10*3/uL (ref 0.0–0.7)
HEMATOCRIT: 46.8 % — AB (ref 36.0–46.0)
Hemoglobin: 15.3 g/dL — ABNORMAL HIGH (ref 12.0–15.0)
LYMPHS ABS: 1.4 10*3/uL (ref 0.7–4.0)
LYMPHS PCT: 26.1 % (ref 12.0–46.0)
MCHC: 32.8 g/dL (ref 30.0–36.0)
MCV: 91.7 fl (ref 78.0–100.0)
MONOS PCT: 6.9 % (ref 3.0–12.0)
Monocytes Absolute: 0.4 10*3/uL (ref 0.1–1.0)
NEUTROS ABS: 3.4 10*3/uL (ref 1.4–7.7)
NEUTROS PCT: 65.4 % (ref 43.0–77.0)
Platelets: 254 10*3/uL (ref 150.0–400.0)
RBC: 5.1 Mil/uL (ref 3.87–5.11)
RDW: 12.8 % (ref 11.5–15.5)
WBC: 5.2 10*3/uL (ref 4.0–10.5)

## 2016-10-26 LAB — LIPID PANEL
CHOL/HDL RATIO: 3
Cholesterol: 159 mg/dL (ref 0–200)
HDL: 59.2 mg/dL (ref >39.00)
LDL CALC: 83 mg/dL (ref 0–99)
NONHDL: 100.14
Triglycerides: 87 mg/dL (ref 0.0–149.0)
VLDL: 17.4 mg/dL (ref 0.0–40.0)

## 2016-10-26 LAB — HEPATIC FUNCTION PANEL
ALK PHOS: 55 U/L (ref 39–117)
ALT: 19 U/L (ref 0–35)
AST: 18 U/L (ref 0–37)
Albumin: 4.4 g/dL (ref 3.5–5.2)
BILIRUBIN DIRECT: 0.1 mg/dL (ref 0.0–0.3)
Total Bilirubin: 0.6 mg/dL (ref 0.2–1.2)
Total Protein: 6.5 g/dL (ref 6.0–8.3)

## 2016-10-26 LAB — TSH: TSH: 1.22 u[IU]/mL (ref 0.35–4.50)

## 2016-10-26 LAB — VITAMIN D 25 HYDROXY (VIT D DEFICIENCY, FRACTURES): VITD: 99.61 ng/mL (ref 30.00–100.00)

## 2016-10-26 NOTE — Patient Instructions (Addendum)
Follow up in 1 year or as needed We'll notify you of your lab results and make any changes if needed Continue to work on healthy diet and regular exercise- you look great! Call with any questions or concerns Happy Holidays!!! 

## 2016-10-26 NOTE — Assessment & Plan Note (Signed)
Pt's PE WNL.  UTD on GYN, colonoscopy (due for repeat- referral placed).  Check EKG as baseline (see interpretation in chart).  Check labs.  Anticipatory guidance provided.

## 2016-10-26 NOTE — Progress Notes (Signed)
Pre visit review using our clinic review tool, if applicable. No additional management support is needed unless otherwise documented below in the visit note. 

## 2016-10-26 NOTE — Progress Notes (Signed)
   Subjective:    Patient ID: Sandra Mora, female    DOB: 11-27-1955, 60 y.o.   MRN: [id number]  HPI CPE- UTD on GYN (mammo, pap, DEXA).  Pt is due for repeat Prolia injxn this month.  Due for referral back to GI for repeat colonoscopy screen.   Review of Systems Patient reports no vision/ hearing changes, adenopathy,fever, weight change,  persistant/recurrent hoarseness , swallowing issues, chest pain, palpitations, edema, persistant/recurrent cough, hemoptysis, dyspnea (rest/exertional/paroxysmal nocturnal), gastrointestinal bleeding (melena, rectal bleeding), abdominal pain, significant heartburn, bowel changes, GU symptoms (dysuria, hematuria, incontinence), Gyn symptoms (abnormal  bleeding, pain),  syncope, focal weakness, memory loss, numbness & tingling, skin/hair/nail changes, abnormal bruising or bleeding, anxiety, or depression.     Objective:   Physical Exam General Appearance:    Alert, cooperative, no distress, appears stated age  Head:    Normocephalic, without obvious abnormality, atraumatic  Eyes:    PERRL, conjunctiva/corneas clear, EOM's intact, fundi    benign, both eyes  Ears:    Normal TM's and external ear canals, both ears  Nose:   Nares normal, septum midline, mucosa normal, no drainage    or sinus tenderness  Throat:   Lips, mucosa, and tongue normal; teeth and gums normal  Neck:   Supple, symmetrical, trachea midline, no adenopathy;    Thyroid: no enlargement/tenderness/nodules  Back:     Symmetric, no curvature, ROM normal, no CVA tenderness  Lungs:     Clear to auscultation bilaterally, respirations unlabored  Chest Wall:    No tenderness or deformity   Heart:    Regular rate and rhythm, S1 and S2 normal, no murmur, rub   or gallop  Breast Exam:    Deferred to GYN  Abdomen:     Soft, non-tender, bowel sounds active all four quadrants,    no masses, no organomegaly  Genitalia:    Deferred to GYN  Rectal:    Extremities:   Extremities normal, atraumatic, no  cyanosis or edema  Pulses:   2+ and symmetric all extremities  Skin:   Skin color, texture, turgor normal, no rashes or lesions  Lymph nodes:   Cervical, supraclavicular, and axillary nodes normal  Neurologic:   CNII-XII intact, normal strength, sensation and reflexes    throughout          Assessment & Plan:

## 2016-10-27 ENCOUNTER — Encounter: Payer: Self-pay | Admitting: General Practice

## 2016-10-30 ENCOUNTER — Ambulatory Visit: Payer: BC Managed Care – PPO

## 2016-11-03 ENCOUNTER — Encounter: Payer: Self-pay | Admitting: General Practice

## 2016-11-04 ENCOUNTER — Ambulatory Visit (INDEPENDENT_AMBULATORY_CARE_PROVIDER_SITE_OTHER): Payer: BC Managed Care – PPO | Admitting: *Deleted

## 2016-11-04 DIAGNOSIS — M810 Age-related osteoporosis without current pathological fracture: Secondary | ICD-10-CM

## 2016-11-04 MED ORDER — DENOSUMAB 60 MG/ML ~~LOC~~ SOLN
60.0000 mg | Freq: Once | SUBCUTANEOUS | Status: AC
  Administered 2016-11-04: 60 mg via SUBCUTANEOUS

## 2016-11-05 ENCOUNTER — Encounter: Payer: Self-pay | Admitting: Family Medicine

## 2016-11-20 NOTE — Progress Notes (Signed)
Order reviewed and agree w/ injxn for osteoporosis tx

## 2016-12-15 ENCOUNTER — Encounter: Payer: Self-pay | Admitting: General Practice

## 2016-12-25 ENCOUNTER — Telehealth: Payer: Self-pay | Admitting: Family Medicine

## 2016-12-25 NOTE — Telephone Encounter (Signed)
Calling to notify Sandra Mora that Sandra Mora has been approved. Ref# [id number].  They have been unable to fax information as fax has been busy.

## 2016-12-28 NOTE — Telephone Encounter (Signed)
Noted, fax received today.

## 2017-01-06 ENCOUNTER — Telehealth: Payer: Self-pay | Admitting: Family Medicine

## 2017-01-06 NOTE — Telephone Encounter (Signed)
Ricka BurdockKavitra is calling to advise that prolia was approved   12/23/2016- 06/04/2017 120 units auth # [id number]

## 2017-01-07 NOTE — Telephone Encounter (Signed)
Noted, received fax.

## 2017-05-18 ENCOUNTER — Ambulatory Visit (INDEPENDENT_AMBULATORY_CARE_PROVIDER_SITE_OTHER): Payer: BC Managed Care – PPO | Admitting: *Deleted

## 2017-05-18 DIAGNOSIS — M810 Age-related osteoporosis without current pathological fracture: Secondary | ICD-10-CM

## 2017-05-18 MED ORDER — DENOSUMAB 60 MG/ML ~~LOC~~ SOLN
60.0000 mg | Freq: Once | SUBCUTANEOUS | Status: AC
  Administered 2017-05-18: 60 mg via SUBCUTANEOUS

## 2017-05-20 NOTE — Progress Notes (Signed)
Pt here today for Prolia as tx for osteoporosis.  Injxn given w/o complication.

## 2017-06-07 ENCOUNTER — Encounter: Payer: Self-pay | Admitting: Family Medicine

## 2017-06-07 ENCOUNTER — Ambulatory Visit (INDEPENDENT_AMBULATORY_CARE_PROVIDER_SITE_OTHER): Payer: BC Managed Care – PPO | Admitting: Family Medicine

## 2017-06-07 VITALS — BP 120/78 | HR 80 | Temp 98.0°F | Resp 16 | Ht 62.0 in | Wt 133.1 lb

## 2017-06-07 DIAGNOSIS — B029 Zoster without complications: Secondary | ICD-10-CM

## 2017-06-07 MED ORDER — VALACYCLOVIR HCL 1 G PO TABS: 1000.0000 mg | ORAL_TABLET | Freq: Three times a day (TID) | ORAL | 0 refills | Status: DC

## 2017-06-07 NOTE — Progress Notes (Signed)
Pre visit review using our clinic review tool, if applicable. No additional management support is needed unless otherwise documented below in the visit note. 

## 2017-06-07 NOTE — Patient Instructions (Signed)
Follow up as needed/scheduled Start the Valtrex 3x/day x7 days Hydrocortisone or neosporin as needed Call with any questions or concerns Hang in there!!!

## 2017-06-07 NOTE — Progress Notes (Signed)
   Subjective:    Patient ID: Sandra GarnerNancy C Mora, female    DOB: 10-23-1956, 61 y.o.   MRN: [id number]  HPI Shingles- pt reports she had 1 spot on her back on Thursday.  Thursday night she 'felt she was coming down w/ something'.  Friday AM noticed linear pattern.  No itching, starting to hurt.   Review of Systems For ROS see HPI     Objective:   Physical Exam  Constitutional: She is oriented to person, place, and time. She appears well-developed and well-nourished. No distress.  HENT:  Head: Normocephalic and atraumatic.  Neurological: She is alert and oriented to person, place, and time.  Skin: Skin is warm and dry. Rash (L sided erythematous rash on back- mixed vesicular/maculopapular w/ mild TTP) noted.  Psychiatric: She has a normal mood and affect. Her behavior is normal. Thought content normal.  Vitals reviewed.         Assessment & Plan:  Zoster- new.  Rash is not classic but it is suspicious enough to warrant tx.  Start Valtrex.  Reviewed supportive care and red flags that should prompt return.  Pt expressed understanding and is in agreement w/ plan.

## 2017-06-10 ENCOUNTER — Telehealth: Payer: Self-pay | Admitting: Family Medicine

## 2017-06-10 NOTE — Telephone Encounter (Signed)
Increase fluid intake and take tylenol as needed

## 2017-06-10 NOTE — Telephone Encounter (Signed)
Please advise 

## 2017-06-10 NOTE — Telephone Encounter (Signed)
Pt states that she has been taking Valtrex for ths past 4 days and is now getting headaches. Pt asking what should she do, please advise

## 2017-06-10 NOTE — Telephone Encounter (Signed)
Patient notified of PCP recommendations and is agreement and expresses an understanding.  She took her migraine medication and her headache is much better.

## 2017-10-27 ENCOUNTER — Telehealth: Payer: Self-pay | Admitting: *Deleted

## 2017-10-27 ENCOUNTER — Other Ambulatory Visit: Payer: Self-pay

## 2017-10-27 ENCOUNTER — Encounter: Payer: Self-pay | Admitting: Family Medicine

## 2017-10-27 ENCOUNTER — Ambulatory Visit (INDEPENDENT_AMBULATORY_CARE_PROVIDER_SITE_OTHER): Payer: BC Managed Care – PPO | Admitting: Family Medicine

## 2017-10-27 VITALS — BP 120/81 | HR 82 | Temp 97.9°F | Resp 16 | Ht 62.0 in | Wt 136.1 lb

## 2017-10-27 DIAGNOSIS — Z0000 Encounter for general adult medical examination without abnormal findings: Secondary | ICD-10-CM | POA: Diagnosis not present

## 2017-10-27 DIAGNOSIS — M810 Age-related osteoporosis without current pathological fracture: Secondary | ICD-10-CM | POA: Diagnosis not present

## 2017-10-27 LAB — CBC WITH DIFFERENTIAL/PLATELET
BASOS ABS: 0.1 10*3/uL (ref 0.0–0.1)
Basophils Relative: 0.7 % (ref 0.0–3.0)
EOS PCT: 1.1 % (ref 0.0–5.0)
Eosinophils Absolute: 0.1 10*3/uL (ref 0.0–0.7)
HCT: 45.7 % (ref 36.0–46.0)
Hemoglobin: 15.1 g/dL — ABNORMAL HIGH (ref 12.0–15.0)
LYMPHS PCT: 20.9 % (ref 12.0–46.0)
Lymphs Abs: 1.5 10*3/uL (ref 0.7–4.0)
MCHC: 33.1 g/dL (ref 30.0–36.0)
MCV: 91.9 fl (ref 78.0–100.0)
MONOS PCT: 7.9 % (ref 3.0–12.0)
Monocytes Absolute: 0.6 10*3/uL (ref 0.1–1.0)
NEUTROS ABS: 5.1 10*3/uL (ref 1.4–7.7)
Neutrophils Relative %: 69.4 % (ref 43.0–77.0)
PLATELETS: 312 10*3/uL (ref 150.0–400.0)
RBC: 4.97 Mil/uL (ref 3.87–5.11)
RDW: 12.6 % (ref 11.5–15.5)
WBC: 7.3 10*3/uL (ref 4.0–10.5)

## 2017-10-27 LAB — HEPATIC FUNCTION PANEL
ALT: 33 U/L (ref 0–35)
AST: 25 U/L (ref 0–37)
Albumin: 4.4 g/dL (ref 3.5–5.2)
Alkaline Phosphatase: 78 U/L (ref 39–117)
BILIRUBIN DIRECT: 0.1 mg/dL (ref 0.0–0.3)
TOTAL PROTEIN: 6.6 g/dL (ref 6.0–8.3)
Total Bilirubin: 0.4 mg/dL (ref 0.2–1.2)

## 2017-10-27 LAB — BASIC METABOLIC PANEL
BUN: 12 mg/dL (ref 6–23)
CHLORIDE: 103 meq/L (ref 96–112)
CO2: 29 meq/L (ref 19–32)
CREATININE: 0.65 mg/dL (ref 0.40–1.20)
Calcium: 9 mg/dL (ref 8.4–10.5)
GFR: 98.43 mL/min (ref >60.00)
Glucose, Bld: 86 mg/dL (ref 70–99)
POTASSIUM: 4.4 meq/L (ref 3.5–5.1)
SODIUM: 140 meq/L (ref 135–145)

## 2017-10-27 LAB — LIPID PANEL
CHOLESTEROL: 144 mg/dL (ref 0–200)
HDL: 51.8 mg/dL (ref >39.00)
LDL Cholesterol: 76 mg/dL (ref 0–99)
NONHDL: 92.21
Total CHOL/HDL Ratio: 3
Triglycerides: 83 mg/dL (ref 0.0–149.0)
VLDL: 16.6 mg/dL (ref 0.0–40.0)

## 2017-10-27 LAB — TSH: TSH: 1.2 u[IU]/mL (ref 0.35–4.50)

## 2017-10-27 LAB — VITAMIN D 25 HYDROXY (VIT D DEFICIENCY, FRACTURES): VITD: 103.28 ng/mL (ref 30.00–100.00)

## 2017-10-27 NOTE — Assessment & Plan Note (Signed)
Chronic problem.  On calcium and Vit D daily.  UTD on DEXA.  Check Vit D and replete prn.

## 2017-10-27 NOTE — Assessment & Plan Note (Signed)
Pt's PE WNL.  UTD on GYN, colonoscopy, and immunizations.  Check labs.  Anticipatory guidance provided.

## 2017-10-27 NOTE — Telephone Encounter (Signed)
Lab called in with Critical Vitamin D level of 103.25 Forwarded to PCP

## 2017-10-27 NOTE — Progress Notes (Signed)
   Subjective:    Patient ID: Sandra GarnerNancy C Mora, female    DOB: [birth year], 61 y.o.   MRN: [id number]  HPI CPE- UTD on pap, mammo, DEXA, colonoscopy.  UTD on Tdap and flu shot.   Review of Systems Patient reports no vision/ hearing changes, adenopathy,fever, weight change,  persistant/recurrent hoarseness , swallowing issues, chest pain, palpitations, edema, persistant/recurrent cough, hemoptysis, dyspnea (rest/exertional/paroxysmal nocturnal), gastrointestinal bleeding (melena, rectal bleeding), abdominal pain, significant heartburn, bowel changes, GU symptoms (dysuria, hematuria, incontinence), Gyn symptoms (abnormal  bleeding, pain),  syncope, focal weakness, memory loss, numbness & tingling, skin/hair/nail changes, abnormal bruising or bleeding, anxiety, or depression.     Objective:   Physical Exam General Appearance:    Alert, cooperative, no distress, appears stated age  Head:    Normocephalic, without obvious abnormality, atraumatic  Eyes:    PERRL, conjunctiva/corneas clear, EOM's intact, fundi    benign, both eyes  Ears:    Normal TM's and external ear canals, both ears  Nose:   Nares normal, septum midline, mucosa normal, no drainage    or sinus tenderness  Throat:   Lips, mucosa, and tongue normal; teeth and gums normal  Neck:   Supple, symmetrical, trachea midline, no adenopathy;    Thyroid: no enlargement/tenderness/nodules  Back:     Symmetric, no curvature, ROM normal, no CVA tenderness  Lungs:     Clear to auscultation bilaterally, respirations unlabored  Chest Wall:    No tenderness or deformity   Heart:    Regular rate and rhythm, S1 and S2 normal, no murmur, rub   or gallop  Breast Exam:    Deferred to GYN  Abdomen:     Soft, non-tender, bowel sounds active all four quadrants,    no masses, no organomegaly  Genitalia:    Deferred to GYN  Rectal:    Extremities:   Extremities normal, atraumatic, no cyanosis or edema  Pulses:   2+ and symmetric all extremities  Skin:    Skin color, texture, turgor normal, no rashes or lesions  Lymph nodes:   Cervical, supraclavicular, and axillary nodes normal  Neurologic:   CNII-XII intact, normal strength, sensation and reflexes    throughout          Assessment & Plan:

## 2017-10-27 NOTE — Patient Instructions (Signed)
Follow up in 1 year or as needed We'll notify you of your lab results and make any changes if needed Keep up the good work on healthy diet and regular exercise- you look great! Start daily Zyrtec in addition to your Flonase to help with the allergy component (you likely have a viral/allergy combo right now) Drink plenty of fluids REST! Call with any questions or concerns Happy Holidays!!!

## 2017-10-28 ENCOUNTER — Other Ambulatory Visit: Payer: Self-pay | Admitting: Family Medicine

## 2017-10-28 ENCOUNTER — Telehealth: Payer: Self-pay | Admitting: General Practice

## 2017-10-28 DIAGNOSIS — E559 Vitamin D deficiency, unspecified: Secondary | ICD-10-CM

## 2017-10-28 NOTE — Progress Notes (Signed)
Called pt and lmovm to return call.    Ok for [facility] to Discuss results / PCP recommendations / Schedule patient.  

## 2017-10-28 NOTE — Telephone Encounter (Signed)
Called pt and left a message to advise that recertification was sent to prolia. Once approval received and Medication ordered. Pt will be contacted to schedule nurse visit.    Copied from CRM [phone]. Topic: Inquiry >> Oct 27, 2017  3:38 PM Alexander BergeronBarksdale, Harvey B wrote: Reason for CRM: pt called to state that she usually gets her prolia inj every 6 months and wanted to get one in January, contact pt to advise on when she can get one

## 2017-11-03 ENCOUNTER — Telehealth: Payer: Self-pay | Admitting: Family Medicine

## 2017-11-03 NOTE — Telephone Encounter (Signed)
Copied from CRM [phone]. Topic: Quick Communication - See Telephone Encounter >> Nov 03, 2017  3:39 PM Cipriano BunkerLambe, Annette S wrote: CRM for notification. See Telephone encounter for:  Returning call for lab results,  [facility] can give results 11/03/17.

## 2017-11-03 NOTE — Telephone Encounter (Signed)
Copied from CRM [phone]. Topic: Quick Communication - Lab Results >> Oct 28, 2017  2:21 PM Brodmerkel, Joyce CopaJessica L, CMA wrote: [facility] / [facility] recommendations / Schedule patient.    Pt needs repeat labs in 2 weeks.

## 2017-11-04 ENCOUNTER — Telehealth: Payer: Self-pay | Admitting: *Deleted

## 2017-11-04 ENCOUNTER — Ambulatory Visit: Payer: Self-pay | Admitting: *Deleted

## 2017-11-04 NOTE — Telephone Encounter (Signed)
If pt is worried about having mono, it would make sense to come in before next week in order to be evaluated/tested.  We have POC tests available at time of visit.

## 2017-11-04 NOTE — Telephone Encounter (Signed)
Copied from CRM [phone]. Topic: Appointment Scheduling - Scheduling Inquiry for Clinic >> Nov 04, 2017  1:54 PM Eston Mouldavis, Cheri B wrote:  Reason for CRM:Pt has lab work scheduled next week and want to know if she can also have mono test the same day due to symptoms she is experiencing -  hot and cold, fatigue, throat swollen,   sore tonsils,  Dr Beverely Lowabori looked at it last Tuesday and didn't see anything abnormal  she thinks she has a viral infection

## 2017-11-04 NOTE — Telephone Encounter (Signed)
She called in c/o having viral like feelings that are coming and going.  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  It all started the weekend before Thanksgiving.   She has been feeling very tired.  Sleeping a lot.  Has small amt of congestion in head and throat.   She feels like her tonsils are swollen.   Her right tonsil has 3 white spots on it.   She has a dry cough at night she thinks is from nasal drip when she lays down.   She has requested a Mono test be added to the blood work she is scheduled for next wk.    She is also concerned not only has the viral like feeling been with her a long time but her husband is on a immunosuppressant  For arthritis and is scheduled for surgery in 2 wks.   She doesn't want to expose him if she has Mono. I have routed a note to Dr. Rennis Goldenabori's nurse pool letting her know of the pt's concerns. Reason for Disposition . [1]Influenza EXPOSURE  (Close Contact) within last 7 days AND [2] NO respiratory symptoms  Answer Assessment - Initial Assessment Questions 1. 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 2. DATE of EXPOSURE: "When did the exposure occur?" (e.g., hour, days, weeks)     I'm exposed every day on my job. 3. PREGNANCY: "Is there any chance you are pregnant?" "When was your last menstrual period?"     No  4. HIGH RISK for COMPLICATIONS: "Do you have any heart or lung problems? Do you have a weakened immune system?" (e.g., CHF, COPD, asthma, HIV positive, chemotherapy, renal failure, diabetes mellitus, sickle cell anemia)     My husband being on a immunosuppressant concerns me because he has surgery scheduled in 2 wks.   If I have mono I need to know. 5. SYMPTOMS: "Do you have any symptoms?" (e.g., cough, fever, sore throat, difficulty breathing).     Coughing mostly at  night from drainage.   I feel like my tonsils are swelling but occasionally.   It's not all the time.  It's mostly my right tonsil.  My right  tonsil has  3 white spots on it.   There is also a yellow pocket like thing under the right tonsil.  You can see the right tonsil.   Can't really see the left tonsil.  Protocols used: INFLUENZA EXPOSURE-A-AH

## 2017-11-05 ENCOUNTER — Other Ambulatory Visit: Payer: Self-pay

## 2017-11-05 ENCOUNTER — Ambulatory Visit: Payer: BC Managed Care – PPO | Admitting: Physician Assistant

## 2017-11-05 ENCOUNTER — Encounter: Payer: Self-pay | Admitting: Physician Assistant

## 2017-11-05 VITALS — BP 120/70 | HR 94 | Temp 98.5°F | Resp 14 | Ht 62.0 in | Wt 138.0 lb

## 2017-11-05 DIAGNOSIS — R5383 Other fatigue: Secondary | ICD-10-CM

## 2017-11-05 DIAGNOSIS — M810 Age-related osteoporosis without current pathological fracture: Secondary | ICD-10-CM

## 2017-11-05 LAB — POCT MONO (EPSTEIN BARR VIRUS): Mono, POC: NEGATIVE

## 2017-11-05 NOTE — Progress Notes (Signed)
Patient presents to clinic today c/o ongoing nasal congestion with intermittent rhinorrhea, sinus pressure and ear pressure. States symptoms have been present since she had a viral illness a couple of months ago that was followed by a round of tonsillitis for which she received antibiotic treatment. States she is concerned that she has mono as she is around numerous children with her work. Is worried she has something she may pass to her husband who is about to start immunosuppressant therapy. Patient denies fever, chills, sweats. Occasional fatigue noted and soreness of the glands in her throat. Does has history of significant allergies, previously requiring injection, but states she has not had to take any medications in some time.  Past Medical History:  Diagnosis Date  . Allergy    seasonal  . Migraine   . Osteoporosis    Spine -3 (2013)    Current Outpatient Medications on File Prior to Visit  Medication Sig Dispense Refill  . Aspirin (ANACIN PO) Take by mouth as needed.      Marland Kitchen. b complex vitamins tablet Take 1 tablet by mouth daily.      . benzonatate (TESSALON) 100 MG capsule TAKE ONE TO TWO CAPSULE 3 TIMES A DAY AS NEEDED.DO NOT BREAK, CHEW, DISSOLVE, CUT OR CRUSH.  0  . Calcium Carbonate-Vitamin D (CALCIUM 600 + D PO) Take by mouth daily.      . Cholecalciferol (VITAMIN D) 2000 UNITS CAPS Take by mouth daily.      . fluticasone (FLONASE) 50 MCG/ACT nasal spray Place into the nose.    . magnesium oxide (MAG-OX) 400 MG tablet Take 400 mg by mouth daily.    . Multiple Vitamin (MULTIVITAMIN) tablet Take 1 tablet by mouth daily.      . Omega-3 Fatty Acids (FISH OIL PO) Take by mouth daily.      . Probiotic Product (PROBIOTIC DAILY PO) Take by mouth.    . Pseudoephedrine HCl (SUDAFED PO) Take by mouth daily as needed.      . SUMAtriptan (IMITREX) 50 MG tablet TAKE 1 TABLET AT ONSET OF HEADACHE, REPEAT IN 2 HRS IF HEADACHE PERSISTS **MAX 2 TAB IN 24 HRS** 10 tablet 11  . VITAMIN K PO  Take by mouth.     No current facility-administered medications on file prior to visit.     Allergies  Allergen Reactions  . Codeine Nausea And Vomiting    History reviewed. No pertinent family history.  Social History   Socioeconomic History  . Marital status: Single    Spouse name: None  . Number of children: None  . Years of education: None  . Highest education level: None  Social Needs  . Financial resource strain: None  . Food insecurity - worry: None  . Food insecurity - inability: None  . Transportation needs - medical: None  . Transportation needs - non-medical: None  Occupational History  . Occupation: occupational therapist    Employer: [employer]  Tobacco Use  . Smoking status: Never Smoker  . Smokeless tobacco: Never Used  Substance and Sexual Activity  . Alcohol use: No  . Drug use: No  . Sexual activity: None  Other Topics Concern  . None  Social History Narrative  . None   Review of Systems - See HPI.  All other ROS are negative.  BP 120/70   Pulse 94   Temp 98.5 F (36.9 C) (Oral)   Resp 14   Ht 5\' 2"  (1.575 m)   Wt  138 lb (62.6 kg)   SpO2 97%   BMI 25.24 kg/m   Physical Exam  Constitutional: She is oriented to person, place, and time and well-developed, well-nourished, and in no distress.  HENT:  Head: Normocephalic and atraumatic.  Right Ear: External ear normal.  Left Ear: External ear normal.  Nose: Nose normal.  Mouth/Throat: Oropharynx is clear and moist. No oropharyngeal exudate.  TM within normal limits bilaterally.  Eyes: Conjunctivae are normal. Pupils are equal, round, and reactive to light.  Neck: Neck supple.  Cardiovascular: Normal rate, regular rhythm, normal heart sounds and intact distal pulses.  Pulmonary/Chest: Effort normal and breath sounds normal. No respiratory distress. She has no wheezes. She has no rales. She exhibits no tenderness.  Lymphadenopathy:    She has no cervical adenopathy.  Neurological:  She is alert and oriented to person, place, and time.  Skin: Skin is warm and dry. No rash noted.  Psychiatric: Affect normal.  Vitals reviewed.   Recent Results (from the past 2160 hour(s))  Lipid panel     Status: None   Collection Time: 10/27/17  8:39 AM  Result Value Ref Range   Cholesterol 144 0 - 200 mg/dL    Comment: ATP III Classification       Desirable:  < 200 mg/dL               Borderline High:  200 - 239 mg/dL          High:  > = 161 mg/dL   Triglycerides 09.6 0.0 - 149.0 mg/dL    Comment: Normal:  <045 mg/dLBorderline High:  150 - 199 mg/dL   HDL 40.98 >11.91 mg/dL   VLDL 47.8 0.0 - 29.5 mg/dL   LDL Cholesterol 76 0 - 99 mg/dL   Total CHOL/HDL Ratio 3     Comment:                Men          Women1/2 Average Risk     3.4          3.3Average Risk          5.0          4.42X Average Risk          9.6          7.13X Average Risk          15.0          11.0                       NonHDL 92.21     Comment: NOTE:  Non-HDL goal should be 30 mg/dL higher than patient's LDL goal (i.e. LDL goal of < 70 mg/dL, would have non-HDL goal of < 100 mg/dL)  Basic metabolic panel     Status: None   Collection Time: 10/27/17  8:39 AM  Result Value Ref Range   Sodium 140 135 - 145 mEq/L   Potassium 4.4 3.5 - 5.1 mEq/L   Chloride 103 96 - 112 mEq/L   CO2 29 19 - 32 mEq/L   Glucose, Bld 86 70 - 99 mg/dL   BUN 12 6 - 23 mg/dL   Creatinine, Ser 6.21 0.40 - 1.20 mg/dL   Calcium 9.0 8.4 - 30.8 mg/dL   GFR 65.78 >46.96 mL/min  Hepatic function panel     Status: None   Collection Time: 10/27/17  8:39 AM  Result Value Ref Range  Total Bilirubin 0.4 0.2 - 1.2 mg/dL   Bilirubin, Direct 0.1 0.0 - 0.3 mg/dL   Alkaline Phosphatase 78 39 - 117 U/L   AST 25 0 - 37 U/L   ALT 33 0 - 35 U/L   Total Protein 6.6 6.0 - 8.3 g/dL   Albumin 4.4 3.5 - 5.2 g/dL  CBC with Differential/Platelet     Status: Abnormal   Collection Time: 10/27/17  8:39 AM  Result Value Ref Range   WBC 7.3 4.0 - 10.5 K/uL    RBC 4.97 3.87 - 5.11 Mil/uL   Hemoglobin 15.1 (H) 12.0 - 15.0 g/dL   HCT 40.945.7 81.136.0 - 91.446.0 %   MCV 91.9 78.0 - 100.0 fl   MCHC 33.1 30.0 - 36.0 g/dL   RDW 78.212.6 95.611.5 - 21.315.5 %   Platelets 312.0 150.0 - 400.0 K/uL   Neutrophils Relative % 69.4 43.0 - 77.0 %   Lymphocytes Relative 20.9 12.0 - 46.0 %   Monocytes Relative 7.9 3.0 - 12.0 %   Eosinophils Relative 1.1 0.0 - 5.0 %   Basophils Relative 0.7 0.0 - 3.0 %   Neutro Abs 5.1 1.4 - 7.7 K/uL   Lymphs Abs 1.5 0.7 - 4.0 K/uL   Monocytes Absolute 0.6 0.1 - 1.0 K/uL   Eosinophils Absolute 0.1 0.0 - 0.7 K/uL   Basophils Absolute 0.1 0.0 - 0.1 K/uL  VITAMIN D 25 Hydroxy (Vit-D Deficiency, Fractures)     Status: Abnormal   Collection Time: 10/27/17  8:39 AM  Result Value Ref Range   VITD 103.28 (HH) 30.00 - 100.00 ng/mL  TSH     Status: None   Collection Time: 10/27/17  8:39 AM  Result Value Ref Range   TSH 1.20 0.35 - 4.50 uIU/mL    Assessment/Plan: 1. Age-related osteoporosis without current pathological fracture Repeat Vitamin D today as was elevated on last check with PCP. Patient has stopped supplement as directed. - Vitamin D (25 hydroxy)  2. Fatigue, unspecified type Seems most consistent with chronic allergies. No fever, chills or sweats. Exam unremarkable except for mild nasal congestion and clear PND. Monospot checked due to patient concerns and negative. Will check sed rate today. Start daily Xyzal and flonase. Follow-up 2 weeks. - Sedimentation rate - POCT Mono (Epstein Barr Virus)   Piedad ClimesWilliam Cody Charmain Diosdado, PA-C

## 2017-11-05 NOTE — Telephone Encounter (Signed)
Pt was scheduled with Selena Battenody today at 3:15. Pt needs repeat vitamin D due to it being elevated.

## 2017-11-05 NOTE — Patient Instructions (Signed)
Please go to the lab today for blood work.  I will call you with your results. We will alter treatment regimen(s) if indicated by your results.   Please stay well hydrated and get plenty of rest. Start over-the-counter Xyzal daily along with Flonase. Follow-up in 2 weeks for reassessment.  If we need to change gears after results, we will and change follow-up.

## 2017-11-05 NOTE — Telephone Encounter (Signed)
Noted  

## 2017-11-06 LAB — SEDIMENTATION RATE: SED RATE: 22 mm/h (ref 0–30)

## 2017-11-06 LAB — VITAMIN D 25 HYDROXY (VIT D DEFICIENCY, FRACTURES): VIT D 25 HYDROXY: 94 ng/mL (ref 30–100)

## 2017-11-10 ENCOUNTER — Other Ambulatory Visit: Payer: BC Managed Care – PPO

## 2017-11-22 ENCOUNTER — Ambulatory Visit: Payer: BC Managed Care – PPO

## 2017-11-25 ENCOUNTER — Encounter: Payer: Self-pay | Admitting: Physician Assistant

## 2017-11-25 ENCOUNTER — Ambulatory Visit: Payer: BC Managed Care – PPO | Admitting: Physician Assistant

## 2017-11-25 VITALS — BP 110/70 | HR 74 | Temp 97.9°F | Resp 14 | Ht 62.0 in | Wt 138.0 lb

## 2017-11-25 DIAGNOSIS — R5383 Other fatigue: Secondary | ICD-10-CM | POA: Diagnosis not present

## 2017-11-25 NOTE — Progress Notes (Signed)
Patient presents to clinic today for follow-up of fatigue felt to be secondary to allergies. Patient endorses since last, following instructions. Endorses resolution of all symptoms. Notes good energy and no continued allergy symptoms.   Patient is asking about her Prolia injection today.  Past Medical History:  Diagnosis Date  . Allergy    seasonal  . Migraine   . Osteoporosis    Spine -3 (2013)    Current Outpatient Medications on File Prior to Visit  Medication Sig Dispense Refill  . Aspirin (ANACIN PO) Take by mouth as needed.      Marland Kitchen. b complex vitamins tablet Take 1 tablet by mouth daily.      . benzonatate (TESSALON) 100 MG capsule TAKE ONE TO TWO CAPSULE 3 TIMES A DAY AS NEEDED.DO NOT BREAK, CHEW, DISSOLVE, CUT OR CRUSH.  0  . Calcium Carbonate-Vitamin D (CALCIUM 600 + D PO) Take by mouth daily.      . Cholecalciferol (VITAMIN D) 2000 UNITS CAPS Take by mouth daily.      . magnesium oxide (MAG-OX) 400 MG tablet Take 400 mg by mouth daily.    . Multiple Vitamin (MULTIVITAMIN) tablet Take 1 tablet by mouth daily.      . Omega-3 Fatty Acids (FISH OIL PO) Take by mouth daily.      . Probiotic Product (PROBIOTIC DAILY PO) Take by mouth.    . Pseudoephedrine HCl (SUDAFED PO) Take by mouth daily as needed.      . SUMAtriptan (IMITREX) 50 MG tablet TAKE 1 TABLET AT ONSET OF HEADACHE, REPEAT IN 2 HRS IF HEADACHE PERSISTS **MAX 2 TAB IN 24 HRS** 10 tablet 11  . VITAMIN K PO Take by mouth.    . fluticasone (FLONASE) 50 MCG/ACT nasal spray Place into the nose.     No current facility-administered medications on file prior to visit.     Allergies  Allergen Reactions  . Codeine Nausea And Vomiting    History reviewed. No pertinent family history.  Social History   Socioeconomic History  . Marital status: Single    Spouse name: None  . Number of children: None  . Years of education: None  . Highest education level: None  Social Needs  . Financial resource strain: None  .  Food insecurity - worry: None  . Food insecurity - inability: None  . Transportation needs - medical: None  . Transportation needs - non-medical: None  Occupational History  . Occupation: occupational therapist    Employer: [employer]  Tobacco Use  . Smoking status: Never Smoker  . Smokeless tobacco: Never Used  Substance and Sexual Activity  . Alcohol use: No  . Drug use: No  . Sexual activity: None  Other Topics Concern  . None  Social History Narrative  . None    Review of Systems - See HPI.  All other ROS are negative.  BP 110/70   Pulse 74   Temp 97.9 F (36.6 C) (Oral)   Resp 14   Ht 5\' 2"  (1.575 m)   Wt 138 lb (62.6 kg)   SpO2 99%   BMI 25.24 kg/m   Physical Exam  HENT:  Head: Normocephalic and atraumatic.  Cardiovascular: Normal rate and regular rhythm.  Pulmonary/Chest: Effort normal.  Neurological: She is alert.  Skin: Skin is warm and dry.  Psychiatric: Affect normal.  Vitals reviewed.   Recent Results (from the past 2160 hour(s))  Lipid panel     Status: None   Collection  Time: 10/27/17  8:39 AM  Result Value Ref Range   Cholesterol 144 0 - 200 mg/dL    Comment: ATP III Classification       Desirable:  < 200 mg/dL               Borderline High:  200 - 239 mg/dL          High:  > = 161 mg/dL   Triglycerides 09.6 0.0 - 149.0 mg/dL    Comment: Normal:  <045 mg/dLBorderline High:  150 - 199 mg/dL   HDL 40.98 >11.91 mg/dL   VLDL 47.8 0.0 - 29.5 mg/dL   LDL Cholesterol 76 0 - 99 mg/dL   Total CHOL/HDL Ratio 3     Comment:                Men          Women1/2 Average Risk     3.4          3.3Average Risk          5.0          4.42X Average Risk          9.6          7.13X Average Risk          15.0          11.0                       NonHDL 92.21     Comment: NOTE:  Non-HDL goal should be 30 mg/dL higher than patient's LDL goal (i.e. LDL goal of < 70 mg/dL, would have non-HDL goal of < 100 mg/dL)  Basic metabolic panel     Status: None    Collection Time: 10/27/17  8:39 AM  Result Value Ref Range   Sodium 140 135 - 145 mEq/L   Potassium 4.4 3.5 - 5.1 mEq/L   Chloride 103 96 - 112 mEq/L   CO2 29 19 - 32 mEq/L   Glucose, Bld 86 70 - 99 mg/dL   BUN 12 6 - 23 mg/dL   Creatinine, Ser 6.21 0.40 - 1.20 mg/dL   Calcium 9.0 8.4 - 30.8 mg/dL   GFR 65.78 >46.96 mL/min  Hepatic function panel     Status: None   Collection Time: 10/27/17  8:39 AM  Result Value Ref Range   Total Bilirubin 0.4 0.2 - 1.2 mg/dL   Bilirubin, Direct 0.1 0.0 - 0.3 mg/dL   Alkaline Phosphatase 78 39 - 117 U/L   AST 25 0 - 37 U/L   ALT 33 0 - 35 U/L   Total Protein 6.6 6.0 - 8.3 g/dL   Albumin 4.4 3.5 - 5.2 g/dL  CBC with Differential/Platelet     Status: Abnormal   Collection Time: 10/27/17  8:39 AM  Result Value Ref Range   WBC 7.3 4.0 - 10.5 K/uL   RBC 4.97 3.87 - 5.11 Mil/uL   Hemoglobin 15.1 (H) 12.0 - 15.0 g/dL   HCT 29.5 28.4 - 13.2 %   MCV 91.9 78.0 - 100.0 fl   MCHC 33.1 30.0 - 36.0 g/dL   RDW 44.0 10.2 - 72.5 %   Platelets 312.0 150.0 - 400.0 K/uL   Neutrophils Relative % 69.4 43.0 - 77.0 %   Lymphocytes Relative 20.9 12.0 - 46.0 %   Monocytes Relative 7.9 3.0 - 12.0 %   Eosinophils Relative 1.1 0.0 - 5.0 %   Basophils  Relative 0.7 0.0 - 3.0 %   Neutro Abs 5.1 1.4 - 7.7 K/uL   Lymphs Abs 1.5 0.7 - 4.0 K/uL   Monocytes Absolute 0.6 0.1 - 1.0 K/uL   Eosinophils Absolute 0.1 0.0 - 0.7 K/uL   Basophils Absolute 0.1 0.0 - 0.1 K/uL  VITAMIN D 25 Hydroxy (Vit-D Deficiency, Fractures)     Status: Abnormal   Collection Time: 10/27/17  8:39 AM  Result Value Ref Range   VITD 103.28 (HH) 30.00 - 100.00 ng/mL  TSH     Status: None   Collection Time: 10/27/17  8:39 AM  Result Value Ref Range   TSH 1.20 0.35 - 4.50 uIU/mL  Sedimentation rate     Status: None   Collection Time: 11/05/17  4:10 PM  Result Value Ref Range   Sed Rate 22 0 - 30 mm/h  Vitamin D (25 hydroxy)     Status: None   Collection Time: 11/05/17  4:10 PM  Result Value Ref  Range   Vit D, 25-Hydroxy 94 30 - 100 ng/mL    Comment: Vitamin D Status         25-OH Vitamin D: . Deficiency:                    <20 ng/mL Insufficiency:             20 - 29 ng/mL Optimal:                 > or = 30 ng/mL . For 25-OH Vitamin D testing on patients on  D2-supplementation and patients for whom quantitation  of D2 and D3 fractions is required, the QuestAssureD(TM) 25-OH VIT D, (D2,D3), LC/MS/MS is recommended: order  code 16109 (patients >29yrs). . For more information on this test, go to: http://education.questdiagnostics.com/faq/FAQ163 (This link is being provided for  informational/educational purposes only.)   POCT Mono Malachi Carl Virus)     Status: Normal   Collection Time: 11/05/17  4:10 PM  Result Value Ref Range   Mono, POC Negative Negative    Assessment/Plan: 1. Fatigue, unspecified type Resolved. No ongoing symptoms. Likely combination of chronic allergies and viral illness. No follow-up needed at present.  Prolia has to be re-certified due to new year and insurance changes. Patient was scheduled to come in on 11/22/17 for injection before the new year.     Piedad Climes, PA-C

## 2017-12-02 ENCOUNTER — Telehealth: Payer: Self-pay | Admitting: Family Medicine

## 2017-12-02 NOTE — Telephone Encounter (Signed)
Copied from CRM [id number]. Topic: Quick Communication - Lab Results >> Dec 02, 2017  3:56 PM Sandra Mora, Rosey Batheresa D wrote: Patient called and would like to have the Prolia injection done this month and would like to know when she can come in for it? Please call patient, thanks.

## 2017-12-03 NOTE — Telephone Encounter (Signed)
LM for patient letting her know that we have not received approval from insurance yet. As soon as we do, we will call her to schedule.

## 2017-12-10 ENCOUNTER — Telehealth: Payer: Self-pay | Admitting: General Practice

## 2017-12-10 NOTE — Telephone Encounter (Signed)
Prolia was approved. Pt will be scheduled for appt.   Copied from CRM [id number]. Topic: Inquiry >> Dec 09, 2017  4:46 PM Terisa Starraylor, Brittany L wrote: [employer] wants to know will the procedure in the office? Call back is [phone]

## 2017-12-13 ENCOUNTER — Ambulatory Visit (INDEPENDENT_AMBULATORY_CARE_PROVIDER_SITE_OTHER): Payer: BC Managed Care – PPO | Admitting: *Deleted

## 2017-12-13 DIAGNOSIS — M810 Age-related osteoporosis without current pathological fracture: Secondary | ICD-10-CM | POA: Diagnosis not present

## 2017-12-13 MED ORDER — DENOSUMAB 60 MG/ML ~~LOC~~ SOLN
60.0000 mg | Freq: Once | SUBCUTANEOUS | Status: AC
  Administered 2017-12-13: 60 mg via SUBCUTANEOUS

## 2018-02-13 LAB — HM MAMMOGRAPHY: HM Mammogram: NORMAL (ref 0–4)

## 2018-05-13 NOTE — Telephone Encounter (Signed)
Called pt and advised that she is not due for her prolia injection yet as they are given every 6 months. Once we have her recertification complete and a prolia in the office for her. I will call and get her scheduled.

## 2018-05-13 NOTE — Telephone Encounter (Signed)
Pt had last prolia injection 1.21.19; pt is asking when she can get her next one scheduled, call pt to advise

## 2018-06-23 ENCOUNTER — Ambulatory Visit (INDEPENDENT_AMBULATORY_CARE_PROVIDER_SITE_OTHER): Payer: BC Managed Care – PPO | Admitting: General Practice

## 2018-06-23 DIAGNOSIS — M810 Age-related osteoporosis without current pathological fracture: Secondary | ICD-10-CM

## 2018-06-23 MED ORDER — DENOSUMAB 60 MG/ML ~~LOC~~ SOSY
60.0000 mg | PREFILLED_SYRINGE | Freq: Once | SUBCUTANEOUS | Status: AC
  Administered 2018-06-23: 60 mg via SUBCUTANEOUS

## 2018-06-23 NOTE — Progress Notes (Signed)
Sandra Mora is a 62 y.o. female presents to the office today for prolia injection, per physician's orders. prolia (med), 60mg /mL (dose),  Coleta (route) was administered left arm (location) today. Patient tolerated injection. Patient due for follow up labs/provider appt:  Shanda BumpsJessica L Samer Dutton

## 2018-07-21 LAB — HM DEXA SCAN

## 2019-01-05 ENCOUNTER — Telehealth: Payer: Self-pay

## 2019-01-05 NOTE — Telephone Encounter (Signed)
Copied from CRM [id number]. 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 The authorization form was scan in pt chart on 12/15/2018

## 2019-01-06 NOTE — Telephone Encounter (Signed)
Noted  

## 2019-01-13 ENCOUNTER — Other Ambulatory Visit: Payer: Self-pay

## 2019-01-13 ENCOUNTER — Ambulatory Visit: Payer: BC Managed Care – PPO | Admitting: Family Medicine

## 2019-01-13 ENCOUNTER — Encounter: Payer: Self-pay | Admitting: Family Medicine

## 2019-01-13 VITALS — BP 113/80 | HR 79 | Temp 98.2°F | Resp 16 | Ht 62.0 in | Wt 136.4 lb

## 2019-01-13 DIAGNOSIS — M810 Age-related osteoporosis without current pathological fracture: Secondary | ICD-10-CM

## 2019-01-13 DIAGNOSIS — Z7184 Encounter for health counseling related to travel: Secondary | ICD-10-CM | POA: Diagnosis not present

## 2019-01-13 MED ORDER — AZITHROMYCIN 250 MG PO TABS: ORAL_TABLET | ORAL | 0 refills | Status: DC

## 2019-01-13 MED ORDER — DENOSUMAB 60 MG/ML ~~LOC~~ SOSY
60.0000 mg | PREFILLED_SYRINGE | Freq: Once | SUBCUTANEOUS | Status: AC
  Administered 2019-01-13: 60 mg via SUBCUTANEOUS

## 2019-01-13 MED ORDER — OSELTAMIVIR PHOSPHATE 75 MG PO CAPS: 75.0000 mg | ORAL_CAPSULE | Freq: Every day | ORAL | 0 refills | Status: DC

## 2019-01-13 NOTE — Assessment & Plan Note (Signed)
Prolia given today. 

## 2019-01-13 NOTE — Progress Notes (Signed)
   Subjective:    Patient ID: Sandra Mora, female    DOB: [birth year], 63 y.o.   MRN: [id number]  HPI Osteoporosis- pt is due for Prolia.  Pt has DEXA done w/ Dr Renaldo Fiddler- done last year and showed improvement.  Travel- pt is going to [place] and [place], will be traveling by plane and boat.  Is fearful of illness.  Asking for Tamiflu to take w/ her 'just in case'.  Pt takes ImmuneC daily.   Review of Systems For ROS see HPI     Objective:   Physical Exam Vitals signs reviewed.  Constitutional:      General: She is not in acute distress.    Appearance: Normal appearance. She is normal weight. She is not diaphoretic.  HENT:     Head: Normocephalic and atraumatic.  Skin:    General: Skin is warm and dry.  Neurological:     General: No focal deficit present.     Mental Status: She is alert and oriented to person, place, and time.  Psychiatric:        Mood and Affect: Mood normal.        Behavior: Behavior normal.        Thought Content: Thought content normal.           Assessment & Plan:  Travel advice- pt is traveling international and is fearful of infection ruining her trip.  Pt was given strict instructions on when to use Tamiflu or Azithro.  Pt expressed understanding and is in agreement w/ plan.

## 2019-01-13 NOTE — Patient Instructions (Signed)
Schedule your complete physical at your convenience Take the Tamiflu only as needed Continue your ImmuneC daily Drink plenty of fluids Wash hands Use the Zpack only for sinus infection Call with any questions or concerns Have a great trip!!!

## 2019-06-20 ENCOUNTER — Other Ambulatory Visit: Payer: Self-pay

## 2019-06-20 ENCOUNTER — Ambulatory Visit (INDEPENDENT_AMBULATORY_CARE_PROVIDER_SITE_OTHER): Payer: BC Managed Care – PPO

## 2019-06-20 DIAGNOSIS — M810 Age-related osteoporosis without current pathological fracture: Secondary | ICD-10-CM | POA: Diagnosis not present

## 2019-06-20 MED ORDER — DENOSUMAB 60 MG/ML ~~LOC~~ SOSY
60.0000 mg | PREFILLED_SYRINGE | Freq: Once | SUBCUTANEOUS | Status: AC
  Administered 2019-06-20: 60 mg via SUBCUTANEOUS

## 2019-06-20 NOTE — Progress Notes (Addendum)
Sandra Mora 63 y.o female presents to office today for 6 month Prolia injection. Ordered given by PCP, Annye Asa, MD. Administered DENOSUMAB 60 mg/mL SubCut left arm. Patient tolerated well. Patient brought recent bone density screening results to office.   Above order is mine.  Annye Asa, MD

## 2019-06-21 NOTE — Addendum Note (Signed)
Addended by: Midge Minium on: 06/21/2019 08:02 AM   Modules accepted: Level of Service

## 2019-06-28 LAB — HM COLONOSCOPY

## 2019-06-29 ENCOUNTER — Encounter: Payer: Self-pay | Admitting: Physician Assistant

## 2019-07-18 ENCOUNTER — Encounter: Payer: Self-pay | Admitting: General Practice

## 2019-08-16 ENCOUNTER — Ambulatory Visit (INDEPENDENT_AMBULATORY_CARE_PROVIDER_SITE_OTHER): Payer: BC Managed Care – PPO | Admitting: Physician Assistant

## 2019-08-16 ENCOUNTER — Encounter: Payer: Self-pay | Admitting: Physician Assistant

## 2019-08-16 ENCOUNTER — Other Ambulatory Visit: Payer: Self-pay

## 2019-08-16 VITALS — BP 130/84 | HR 80 | Temp 97.9°F

## 2019-08-16 DIAGNOSIS — J310 Chronic rhinitis: Secondary | ICD-10-CM

## 2019-08-16 DIAGNOSIS — J0100 Acute maxillary sinusitis, unspecified: Secondary | ICD-10-CM

## 2019-08-16 MED ORDER — AMOXICILLIN-POT CLAVULANATE 875-125 MG PO TABS: 1.0000 | ORAL_TABLET | Freq: Two times a day (BID) | ORAL | 0 refills | Status: DC

## 2019-08-16 NOTE — Patient Instructions (Signed)
Please take antibiotic as directed.  Increase fluid intake.  Use Saline nasal spray.  Take a daily multivitamin. Continue allergy medications daily.  Place a humidifier in the bedroom.  Please call or return clinic if symptoms are not improving.  Sinusitis Sinusitis is redness, soreness, and swelling (inflammation) of the paranasal sinuses. Paranasal sinuses are air pockets within the bones of your face (beneath the eyes, the middle of the forehead, or above the eyes). In healthy paranasal sinuses, mucus is able to drain out, and air is able to circulate through them by way of your nose. However, when your paranasal sinuses are inflamed, mucus and air can become trapped. This can allow bacteria and other germs to grow and cause infection. Sinusitis can develop quickly and last only a short time (acute) or continue over a long period (chronic). Sinusitis that lasts for more than 12 weeks is considered chronic.  CAUSES  Causes of sinusitis include:  Allergies.  Structural abnormalities, such as displacement of the cartilage that separates your nostrils (deviated septum), which can decrease the air flow through your nose and sinuses and affect sinus drainage.  Functional abnormalities, such as when the small hairs (cilia) that line your sinuses and help remove mucus do not work properly or are not present. SYMPTOMS  Symptoms of acute and chronic sinusitis are the same. The primary symptoms are pain and pressure around the affected sinuses. Other symptoms include:  Upper toothache.  Earache.  Headache.  Bad breath.  Decreased sense of smell and taste.  A cough, which worsens when you are lying flat.  Fatigue.  Fever.  Thick drainage from your nose, which often is green and may contain pus (purulent).  Swelling and warmth over the affected sinuses. DIAGNOSIS  Your caregiver will perform a physical exam. During the exam, your caregiver may:  Look in your nose for signs of abnormal  growths in your nostrils (nasal polyps).  Tap over the affected sinus to check for signs of infection.  View the inside of your sinuses (endoscopy) with a special imaging device with a light attached (endoscope), which is inserted into your sinuses. If your caregiver suspects that you have chronic sinusitis, one or more of the following tests may be recommended:  Allergy tests.  Nasal culture A sample of mucus is taken from your nose and sent to a lab and screened for bacteria.  Nasal cytology A sample of mucus is taken from your nose and examined by your caregiver to determine if your sinusitis is related to an allergy. TREATMENT  Most cases of acute sinusitis are related to a viral infection and will resolve on their own within 10 days. Sometimes medicines are prescribed to help relieve symptoms (pain medicine, decongestants, nasal steroid sprays, or saline sprays).  However, for sinusitis related to a bacterial infection, your caregiver will prescribe antibiotic medicines. These are medicines that will help kill the bacteria causing the infection.  Rarely, sinusitis is caused by a fungal infection. In theses cases, your caregiver will prescribe antifungal medicine. For some cases of chronic sinusitis, surgery is needed. Generally, these are cases in which sinusitis recurs more than 3 times per year, despite other treatments. HOME CARE INSTRUCTIONS   Drink plenty of water. Water helps thin the mucus so your sinuses can drain more easily.  Use a humidifier.  Inhale steam 3 to 4 times a day (for example, sit in the bathroom with the shower running).  Apply a warm, moist washcloth to your face 3 to  4 times a day, or as directed by your caregiver.  Use saline nasal sprays to help moisten and clean your sinuses.  Take over-the-counter or prescription medicines for pain, discomfort, or fever only as directed by your caregiver. SEEK IMMEDIATE MEDICAL CARE IF:  You have increasing pain or  severe headaches.  You have nausea, vomiting, or drowsiness.  You have swelling around your face.  You have vision problems.  You have a stiff neck.  You have difficulty breathing. MAKE SURE YOU:   Understand these instructions.  Will watch your condition.  Will get help right away if you are not doing well or get worse. Document Released: 11/09/2005 Document Revised: 02/01/2012 Document Reviewed: 11/24/2011 [facility] Patient Information 2014 [place], [place].

## 2019-08-16 NOTE — Progress Notes (Signed)
Virtual Visit via Video   I connected with patient on 08/16/19 at 11:30 AM EDT by a video enabled telemedicine application and verified that I am speaking with the correct person using two identifiers.  Location patient: Home Location provider: [facility], Office Persons participating in the virtual visit: Patient, Provider, [place] (Patina Moore)  I discussed the limitations of evaluation and management by telemedicine and the availability of in person appointments. The patient expressed understanding and agreed to proceed.  Subjective:   HPI:   Patient presents via D[facility] today c/o increase in allergy symptoms over the past month with nasal congestion, rhinorrhea and fatigue. Notes taking her OTC Xyzal and Nasonex which help keep symptoms at [place] but over the past week now noting sinus pressure, sinus pain and now with dental pain, in R maxillary region Has history of sinusitis, especially in the fall of each year. Denies fever, chills, recent travel or sick contact. Denies exposure to COIVID. Denies change to taste or smell.   ROS:   See pertinent positives and negatives per HPI.  Patient Active Problem List   Diagnosis Date Noted  . Osteoporosis   . Allergic rhinitis 08/06/2014  . Acute bronchitis 08/06/2014  . Physical exam, annual 07/27/2012  . Migraine     Social History   Tobacco Use  . Smoking status: Never Smoker  . Smokeless tobacco: Never Used  Substance Use Topics  . Alcohol use: No    Current Outpatient Medications:  .  Aspirin (ANACIN PO), Take by mouth as needed.  , Disp: , Rfl:  .  b complex vitamins tablet, Take 1 tablet by mouth daily.  , Disp: , Rfl:  .  Calcium Carbonate-Vitamin D (CALCIUM 600 + D PO), Take by mouth daily.  , Disp: , Rfl:  .  Cholecalciferol (VITAMIN D) 2000 UNITS CAPS, Take by mouth daily.  , Disp: , Rfl:  .  fluticasone (FLONASE) 50 MCG/ACT nasal spray, Place into the nose., Disp: , Rfl:  .  magnesium oxide (MAG-OX) 400 MG  tablet, Take 400 mg by mouth daily., Disp: , Rfl:  .  Multiple Vitamin (MULTIVITAMIN) tablet, Take 1 tablet by mouth daily.  , Disp: , Rfl:  .  Omega-3 Fatty Acids (FISH OIL PO), Take by mouth daily.  , Disp: , Rfl:  .  Probiotic Product (PROBIOTIC DAILY PO), Take by mouth., Disp: , Rfl:  .  Pseudoephedrine HCl (SUDAFED PO), Take by mouth daily as needed.  , Disp: , Rfl:  .  SUMAtriptan (IMITREX) 50 MG tablet, TAKE 1 TABLET AT ONSET OF HEADACHE, REPEAT IN 2 HRS IF HEADACHE PERSISTS **MAX 2 TAB IN 24 HRS**, Disp: 10 tablet, Rfl: 11 .  VITAMIN K PO, Take by mouth., Disp: , Rfl:   Allergies  Allergen Reactions  . Codeine Nausea And Vomiting    Objective:   BP 130/84   Pulse 80   Temp 97.9 F (36.6 C) (Skin)   Patient is well-developed, well-nourished in no acute distress.  Resting comfortably at home.  Head is normocephalic, atraumatic.  No labored breathing.  Speech is clear and coherent with logical content.  Patient is alert and oriented at baseline.  + TTP of R maxillary sinuses (patient-performed)  Assessment and Plan:   1. Chronic rhinitis 2. Acute non-recurrent maxillary sinusitis  Rx augmentin.  Increase fluids.  Rest.  Saline nasal spray.  Probiotic.  Mucinex as directed.  Humidifier in bedroom. Continue allergy medications (antihistamine and nasal steroid).  Call or return  to clinic if symptoms are not improving.     Piedad Climes, PA-C 08/16/2019

## 2019-08-16 NOTE — Progress Notes (Signed)
I have discussed the procedure for the virtual visit with the patient who has given consent to proceed with assessment and treatment.   Chadric Kimberley S Drako Maese, CMA     

## 2019-10-09 ENCOUNTER — Other Ambulatory Visit: Payer: Self-pay

## 2019-10-09 ENCOUNTER — Encounter: Payer: Self-pay | Admitting: Family Medicine

## 2019-10-09 ENCOUNTER — Telehealth: Payer: Self-pay | Admitting: Family Medicine

## 2019-10-09 ENCOUNTER — Ambulatory Visit (INDEPENDENT_AMBULATORY_CARE_PROVIDER_SITE_OTHER): Payer: BC Managed Care – PPO | Admitting: Family Medicine

## 2019-10-09 VITALS — Temp 97.7°F

## 2019-10-09 DIAGNOSIS — J0101 Acute recurrent maxillary sinusitis: Secondary | ICD-10-CM | POA: Diagnosis not present

## 2019-10-09 MED ORDER — DOXYCYCLINE HYCLATE 100 MG PO TABS: 100.0000 mg | ORAL_TABLET | Freq: Two times a day (BID) | ORAL | 0 refills | Status: DC

## 2019-10-09 NOTE — Progress Notes (Signed)
Virtual Visit via Video   I connected with patient on 10/09/19 at  4:00 PM EST by a video enabled telemedicine application and verified that I am speaking with the correct person using two identifiers.  Location patient: Home Location provider: [facility], Office Persons participating in the virtual visit: Patient, Provider, [place] (Jess B)  I discussed the limitations of evaluation and management by telemedicine and the availability of in person appointments. The patient expressed understanding and agreed to proceed.  Subjective:   HPI:   Sinusitis- pt was seen at [facility] on 11/12 and dx'd w/ sinusitis.  Started on Augmentin.  Has since developed abd pain, diarrhea, and HA.  Continues to have a lot of pressure in R ear and sinuses.  This AM had 'an unusually bad headache'.  Did not feel typical of migraines but took migraine medication which dulled HA.  No fevers.  Mild neck aches.  No changes to taste/smell.  No cough/SOB.  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    ROS:   See pertinent positives and negatives per HPI.  Patient Active Problem List   Diagnosis Date Noted  . Osteoporosis   . Allergic rhinitis 08/06/2014  . Physical exam, annual 07/27/2012  . Migraine     Social History   Tobacco Use  . Smoking status: Never Smoker  . Smokeless tobacco: Never Used  Substance Use Topics  . Alcohol use: No    Current Outpatient Medications:  .  amoxicillin-clavulanate (AUGMENTIN) 875-125 MG tablet, Take 1 tablet by mouth 2 (two) times daily., Disp: 14 tablet, Rfl: 0 .  Aspirin (ANACIN PO), Take by mouth as needed.  , Disp: , Rfl:  .  b complex vitamins tablet, Take 1 tablet by mouth daily.  , Disp: , Rfl:  .  Calcium Carbonate-Vitamin D (CALCIUM 600 + D PO), Take by mouth daily.  , Disp: , Rfl:  .  Cholecalciferol (VITAMIN D) 2000 UNITS CAPS, Take by mouth daily.  , Disp: , Rfl:  .  magnesium oxide (MAG-OX) 400 MG tablet, Take 400 mg by  mouth daily., Disp: , Rfl:  .  Multiple Vitamin (MULTIVITAMIN) tablet, Take 1 tablet by mouth daily.  , Disp: , Rfl:  .  Omega-3 Fatty Acids (FISH OIL PO), Take by mouth daily.  , Disp: , Rfl:  .  Probiotic Product (PROBIOTIC DAILY PO), Take by mouth., Disp: , Rfl:  .  Pseudoephedrine HCl (SUDAFED PO), Take by mouth daily as needed.  , Disp: , Rfl:  .  SUMAtriptan (IMITREX) 50 MG tablet, TAKE 1 TABLET AT ONSET OF HEADACHE, REPEAT IN 2 HRS IF HEADACHE PERSISTS **MAX 2 TAB IN 24 HRS**, Disp: 10 tablet, Rfl: 11 .  VITAMIN K PO, Take by mouth., Disp: , Rfl:  .  fluticasone (FLONASE) 50 MCG/ACT nasal spray, Place into the nose., Disp: , Rfl:   Allergies  Allergen Reactions  . Codeine Nausea And Vomiting    Objective:   Temp 97.7 F (36.5 C) (Tympanic)  AAOx3, NAD NCAT, EOMI No obvious CN deficits + TTP over R maxillary sinus Coloring WNL Pt is able to speak clearly, coherently without shortness of breath or increased work of breathing.  Thought process is linear.  Mood is appropriate.   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  Pt reports sxs are consistent w/ previous infxns but not improving w/ Augmentin.  Now having diarrhea and  HA.  Will stop Augmentin and switch to Doxy.  If no improvement or worsening, will need COVID test.  Currently she has no change to taste or smell, no fever, minimal body aches, no cough or SOB.  Reviewed supportive care and red flags that should prompt return.  Pt expressed understanding and is in agreement w/ plan.   Neena Rhymes, MD 10/09/2019

## 2019-10-09 NOTE — Telephone Encounter (Signed)
LM asking pt to call back to schedule a VV with Birdie Riddle

## 2019-10-09 NOTE — Telephone Encounter (Signed)
Please schedule pt for a VV to discuss whether this is medication related or a change in symptoms that could be concerning for COVID

## 2019-10-09 NOTE — Progress Notes (Signed)
I have discussed the procedure for the virtual visit with the patient who has given consent to proceed with assessment and treatment.   [facility] Thursday prescribed her antibiotics for ear and sinus pain, pt does not feel as though it is working. Augmentin prescribed for her gave her a severe migraine yesterday and today. Developed diarrhea today.   Pt unable to obtain vitals  Davis Gourd, CMA

## 2019-10-09 NOTE — Telephone Encounter (Signed)
Please advise, would pt need a VV?

## 2019-10-09 NOTE — Telephone Encounter (Signed)
Pt called in stating that she was seen on 10/05/19 at [facility] she states that she has an sinus infection and fluid in her ear. She was giving Augmentin. She states it isn't feeling much better and now stomach is upset and a very bad headache. She wanted to know if the medication should be changed should she be seen again. She wanted to know what to do. Pt can be reached at [phone]

## 2019-11-14 ENCOUNTER — Telehealth: Payer: Self-pay | Admitting: Family Medicine

## 2019-11-14 NOTE — Telephone Encounter (Signed)
Pt called in stating that she will be due for her prolia injection in Jan. She wanted to know if we could go ahead and order the injection now so she can schedule with Jan and before her insurance starts all over.

## 2019-11-15 NOTE — Telephone Encounter (Signed)
I believe that we are not able to process a charge until the injection is given (which would be in January).  I understand that she has met her deductible and that this is frustrating, but I don't think there is anything we can do.  I have [place] on this message as she answers most of our billing/coding questions

## 2019-11-15 NOTE — Telephone Encounter (Signed)
Hello,   Dr. Birdie Riddle is correct, we can not bill for the administration until it is given.  However, if patient brings her own medication , you may be able to order thru pharmacy now if that is where she is getting it.  Not sure if this is what she was wanting.  Thanks,  [employer]

## 2019-11-15 NOTE — Telephone Encounter (Signed)
Since her medication doesn't come from the pharmacy, we are not able to do this

## 2019-11-15 NOTE — Telephone Encounter (Signed)
Please advise, pt would like for me to bill her for the injection now before she is due in January as she has met her deductible already.

## 2019-11-16 NOTE — Telephone Encounter (Signed)
Called and advised pt. She stated an understanding.  

## 2020-01-02 ENCOUNTER — Ambulatory Visit (INDEPENDENT_AMBULATORY_CARE_PROVIDER_SITE_OTHER): Payer: BC Managed Care – PPO | Admitting: Physician Assistant

## 2020-01-02 ENCOUNTER — Other Ambulatory Visit: Payer: Self-pay

## 2020-01-02 ENCOUNTER — Encounter: Payer: Self-pay | Admitting: Physician Assistant

## 2020-01-02 VITALS — Temp 98.2°F

## 2020-01-02 DIAGNOSIS — L089 Local infection of the skin and subcutaneous tissue, unspecified: Secondary | ICD-10-CM | POA: Diagnosis not present

## 2020-01-02 DIAGNOSIS — J320 Chronic maxillary sinusitis: Secondary | ICD-10-CM | POA: Diagnosis not present

## 2020-01-02 MED ORDER — DOXYCYCLINE HYCLATE 100 MG PO CAPS: 100.0000 mg | ORAL_CAPSULE | Freq: Two times a day (BID) | ORAL | 0 refills | Status: DC

## 2020-01-02 NOTE — Progress Notes (Signed)
Virtual Visit via Video Note  I connected with Sandra Mora on 01/02/20 at 11:00 AM EST by a video enabled telemedicine application and verified that I am speaking with the correct person using two identifiers.  Location: Patient: Home Provider: [facility]   I discussed the limitations of evaluation and management by telemedicine and the availability of in person appointments. The patient expressed understanding and agreed to proceed.  History of Present Illness: Patient presents via phone today she was unable to utilize doxy doxy.me.  Patient complains of several days of left-sided nasal congestion with sinus pressure, culminating in sinus pain and tooth pain this morning.  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  States symptoms are completely left-sided.  Denies fever, chills, malaise or fatigue.  Notes some left-sided ear pain.  Denies right-sided symptoms.  Denies chest congestion, cough, loss of taste or smell.  Denies recent travel or sick contact.  Patient has been using her Flonase as directed.  Has started some Sudafed.  Is using saline nasal rinses.  Of note, patient notes having an issue with a pustule of left maxillary region at the line of her mask.  Notes she did pick at this area the other day and it has become more red and irritated.  No active drainage.  Denies any surrounding skin redness, hardness or excess tenderness.   Observations/Objective: No labored breathing.  Speech is clear and coherent with logical content.  Patient is alert and oriented at baseline.   Assessment and Plan: 1. Left maxillary sinusitis We will start antibiotic giving tooth pain, maxillary pain and to cover the potential of an infected skin lesion.  Supportive measures and OTC medications reviewed.  She is to continue her Flonase.  Strict return precautions given. - doxycycline (VIBRAMYCIN) 100 MG capsule; Take 1 capsule (100 mg total) by mouth 2  (two) times daily.  Dispense: 20 capsule; Refill: 0  2. Skin pustule Skin care reviewed with patient.  She has been advised to avoid picking at the area.  Warm compresses.  Astringent recommended.  Doxycycline to cover her sinus infection.  This would offer coverage for infected skin lesion and help prevent cellulitis. - doxycycline (VIBRAMYCIN) 100 MG capsule; Take 1 capsule (100 mg total) by mouth 2 (two) times daily.  Dispense: 20 capsule; Refill: 0   Follow Up Instructions:  I discussed the assessment and treatment plan with the patient. The patient was provided an opportunity to ask questions and all were answered. The patient agreed with the plan and demonstrated an understanding of the instructions.   The patient was advised to call back or seek an in-person evaluation if the symptoms worsen or if the condition fails to improve as anticipated.  I provided 14 minutes of non-face-to-face time during this encounter.   Piedad Climes, PA-C

## 2020-01-02 NOTE — Progress Notes (Signed)
I have discussed the procedure for the virtual visit with the patient who has given consent to proceed with assessment and treatment.   Sandra Mora, CMA     

## 2020-01-02 NOTE — Patient Instructions (Signed)
Instructions sent to MyChart.   Please avoid picking at the area on your face. Keep skin clean and dry.  Apply some OTC astringent to the area [employer]) as this can help dry it up. If you note any increasing redness, tenderness or warmth or note any of these symptoms in the surrounding skin, let me know.   Please take antibiotic as directed.  Increase fluid intake.  Use Saline nasal spray.  Take a daily multivitamin. Continue your Flonase.  Place a humidifier in the bedroom.  Please call or return clinic if symptoms are not improving.  I did check with Shanda Bumps. Your Prolia was approved and we do have one in stock. You can schedule this at your earliest convenience!   Sinusitis Sinusitis is redness, soreness, and swelling (inflammation) of the paranasal sinuses. Paranasal sinuses are air pockets within the bones of your face (beneath the eyes, the middle of the forehead, or above the eyes). In healthy paranasal sinuses, mucus is able to drain out, and air is able to circulate through them by way of your nose. However, when your paranasal sinuses are inflamed, mucus and air can become trapped. This can allow bacteria and other germs to grow and cause infection. Sinusitis can develop quickly and last only a short time (acute) or continue over a long period (chronic). Sinusitis that lasts for more than 12 weeks is considered chronic.  CAUSES  Causes of sinusitis include:  Allergies.  Structural abnormalities, such as displacement of the cartilage that separates your nostrils (deviated septum), which can decrease the air flow through your nose and sinuses and affect sinus drainage.  Functional abnormalities, such as when the small hairs (cilia) that line your sinuses and help remove mucus do not work properly or are not present. SYMPTOMS  Symptoms of acute and chronic sinusitis are the same. The primary symptoms are pain and pressure around the affected sinuses. Other symptoms  include:  Upper toothache.  Earache.  Headache.  Bad breath.  Decreased sense of smell and taste.  A cough, which worsens when you are lying flat.  Fatigue.  Fever.  Thick drainage from your nose, which often is green and may contain pus (purulent).  Swelling and warmth over the affected sinuses. DIAGNOSIS  Your caregiver will perform a physical exam. During the exam, your caregiver may:  Look in your nose for signs of abnormal growths in your nostrils (nasal polyps).  Tap over the affected sinus to check for signs of infection.  View the inside of your sinuses (endoscopy) with a special imaging device with a light attached (endoscope), which is inserted into your sinuses. If your caregiver suspects that you have chronic sinusitis, one or more of the following tests may be recommended:  Allergy tests.  Nasal culture A sample of mucus is taken from your nose and sent to a lab and screened for bacteria.  Nasal cytology A sample of mucus is taken from your nose and examined by your caregiver to determine if your sinusitis is related to an allergy. TREATMENT  Most cases of acute sinusitis are related to a viral infection and will resolve on their own within 10 days. Sometimes medicines are prescribed to help relieve symptoms (pain medicine, decongestants, nasal steroid sprays, or saline sprays).  However, for sinusitis related to a bacterial infection, your caregiver will prescribe antibiotic medicines. These are medicines that will help kill the bacteria causing the infection.  Rarely, sinusitis is caused by a fungal infection. In  theses cases, your caregiver will prescribe antifungal medicine. For some cases of chronic sinusitis, surgery is needed. Generally, these are cases in which sinusitis recurs more than 3 times per year, despite other treatments. HOME CARE INSTRUCTIONS   Drink plenty of water. Water helps thin the mucus so your sinuses can drain more easily.  Use a  humidifier.  Inhale steam 3 to 4 times a day (for example, sit in the bathroom with the shower running).  Apply a warm, moist washcloth to your face 3 to 4 times a day, or as directed by your caregiver.  Use saline nasal sprays to help moisten and clean your sinuses.  Take over-the-counter or prescription medicines for pain, discomfort, or fever only as directed by your caregiver. SEEK IMMEDIATE MEDICAL CARE IF:  You have increasing pain or severe headaches.  You have nausea, vomiting, or drowsiness.  You have swelling around your face.  You have vision problems.  You have a stiff neck.  You have difficulty breathing. MAKE SURE YOU:   Understand these instructions.  Will watch your condition.  Will get help right away if you are not doing well or get worse. Document Released: 11/09/2005 Document Revised: 02/01/2012 Document Reviewed: 11/24/2011 [facility] Patient Information 2014 [place], [place].

## 2020-03-05 ENCOUNTER — Telehealth: Payer: Self-pay | Admitting: Family Medicine

## 2020-03-05 NOTE — Telephone Encounter (Signed)
Pt called asking when she could get her prolia shot. She has her 2nd covid shot on 02/16/20. Please advise

## 2020-03-05 NOTE — Telephone Encounter (Signed)
LM making pt aware she can schedule a nurse visit when ready.

## 2020-03-05 NOTE — Telephone Encounter (Signed)
Please advise 

## 2020-03-05 NOTE — Telephone Encounter (Signed)
Pt can be scheduled. Prolia is in office.

## 2020-03-05 NOTE — Telephone Encounter (Signed)
As long as 14 days have passed, she should be fine to proceed w/ Prolia

## 2020-03-08 ENCOUNTER — Other Ambulatory Visit: Payer: Self-pay

## 2020-03-08 ENCOUNTER — Ambulatory Visit (INDEPENDENT_AMBULATORY_CARE_PROVIDER_SITE_OTHER): Payer: BC Managed Care – PPO | Admitting: General Practice

## 2020-03-08 DIAGNOSIS — M810 Age-related osteoporosis without current pathological fracture: Secondary | ICD-10-CM | POA: Diagnosis not present

## 2020-03-08 MED ORDER — DENOSUMAB 60 MG/ML ~~LOC~~ SOSY
60.0000 mg | PREFILLED_SYRINGE | Freq: Once | SUBCUTANEOUS | Status: AC
  Administered 2020-03-08: 16:00:00 60 mg via SUBCUTANEOUS

## 2020-03-08 NOTE — Progress Notes (Addendum)
Sandra Mora is a 64 y.o. female presents to the office today for Prolia injections, per physician's orders.  Prolia (med), 60mg /mL (dose),  IM(route) was administered Left Arm (location) today. Patient tolerated injection. Patient next injection due: 6 mos, appt made Yes  Sandra Mora  Above order is mine.  , MD

## 2020-03-08 NOTE — Addendum Note (Signed)
Addended by: Sheliah Hatch on: 03/08/2020 04:16 PM   Modules accepted: Level of Service

## 2020-06-10 ENCOUNTER — Telehealth (INDEPENDENT_AMBULATORY_CARE_PROVIDER_SITE_OTHER): Payer: BC Managed Care – PPO | Admitting: Family Medicine

## 2020-06-10 ENCOUNTER — Encounter: Payer: Self-pay | Admitting: Family Medicine

## 2020-06-10 ENCOUNTER — Other Ambulatory Visit: Payer: Self-pay

## 2020-06-10 VITALS — BP 118/72 | HR 99 | Temp 99.6°F

## 2020-06-10 DIAGNOSIS — B9689 Other specified bacterial agents as the cause of diseases classified elsewhere: Secondary | ICD-10-CM

## 2020-06-10 DIAGNOSIS — J329 Chronic sinusitis, unspecified: Secondary | ICD-10-CM | POA: Diagnosis not present

## 2020-06-10 MED ORDER — AMOXICILLIN 875 MG PO TABS: 875.0000 mg | ORAL_TABLET | Freq: Two times a day (BID) | ORAL | 0 refills | Status: DC

## 2020-06-10 NOTE — Progress Notes (Signed)
Virtual Visit via Video   I connected with patient on 06/10/20 at  4:00 PM EDT by a video enabled telemedicine application and verified that I am speaking with the correct person using two identifiers.  Location patient: Home Location provider: [facility], Office Persons participating in the virtual visit: Patient, Provider, CMA (Jess B)  I discussed the limitations of evaluation and management by telemedicine and the availability of in person appointments. The patient expressed understanding and agreed to proceed.  Subjective:   HPI:   Sinusitis- pt had a tummy tuck on Thursday and immediately afterwards developed HA, nasal congestion, sinus pain/pressure.  Took Bactrim on Friday and 1 dose on Saturday but was unable to take meds due to GI upset.  'it's the same thing I always get w/ a sinus infxn'.  R ear is clogged.  Has been using [employer], Sudafed, Xyzal.  Cough causes severe abd pain due to surgical incision.  Pt has had bloody nasal drainage.  ROS:   See pertinent positives and negatives per HPI.  Patient Active Problem List   Diagnosis Date Noted  . Osteoporosis   . Allergic rhinitis 08/06/2014  . Physical exam, annual 07/27/2012  . Migraine     Social History   Tobacco Use  . Smoking status: Never Smoker  . Smokeless tobacco: Never Used  Substance Use Topics  . Alcohol use: No    Current Outpatient Medications:  .  metaxalone (SKELAXIN) 800 MG tablet, Take 800 mg by mouth 3 (three) times daily., Disp: , Rfl:  .  ondansetron (ZOFRAN) 4 MG tablet, Take by mouth., Disp: , Rfl:  .  Pseudoephedrine HCl (SUDAFED PO), Take by mouth daily as needed.  , Disp: , Rfl:  .  Aspirin (ANACIN PO), Take by mouth as needed.   (Patient not taking: Reported on 06/10/2020), Disp: , Rfl:  .  b complex vitamins tablet, Take 1 tablet by mouth daily.   (Patient not taking: Reported on 06/10/2020), Disp: , Rfl:  .  Calcium Carbonate-Vitamin D (CALCIUM 600 + D PO), Take by mouth  daily.   (Patient not taking: Reported on 06/10/2020), Disp: , Rfl:  .  Cholecalciferol (VITAMIN D) 2000 UNITS CAPS, Take by mouth daily.   (Patient not taking: Reported on 06/10/2020), Disp: , Rfl:  .  fluticasone (FLONASE) 50 MCG/ACT nasal spray, Place into the nose., Disp: , Rfl:  .  magnesium oxide (MAG-OX) 400 MG tablet, Take 400 mg by mouth daily. (Patient not taking: Reported on 06/10/2020), Disp: , Rfl:  .  Multiple Vitamin (MULTIVITAMIN) tablet, Take 1 tablet by mouth daily.   (Patient not taking: Reported on 06/10/2020), Disp: , Rfl:  .  Omega-3 Fatty Acids (FISH OIL PO), Take by mouth daily.   (Patient not taking: Reported on 06/10/2020), Disp: , Rfl:  .  Probiotic Product (PROBIOTIC DAILY PO), Take by mouth. (Patient not taking: Reported on 06/10/2020), Disp: , Rfl:  .  sulfamethoxazole-trimethoprim (BACTRIM DS) 800-160 MG tablet, Take 1 tablet by mouth 2 (two) times daily. (Patient not taking: Reported on 06/10/2020), Disp: , Rfl:  .  SUMAtriptan (IMITREX) 50 MG tablet, TAKE 1 TABLET AT ONSET OF HEADACHE, REPEAT IN 2 HRS IF HEADACHE PERSISTS **MAX 2 TAB IN 24 HRS** (Patient not taking: Reported on 06/10/2020), Disp: 10 tablet, Rfl: 11 .  VITAMIN K PO, Take by mouth. (Patient not taking: Reported on 06/10/2020), Disp: , Rfl:   Allergies  Allergen Reactions  . Codeine Nausea And Vomiting  Objective:   BP 118/72   Pulse 99   Temp 99.6 F (37.6 C) (Oral)   AAOx3, NAD NCAT, EOMI No obvious CN deficits TTP over maxillary sinuses Coloring WNL Pt is able to speak clearly, coherently without shortness of breath or increased work of breathing.  Thought process is linear.  Mood is appropriate.   Assessment and Plan:   Bacterial sinusitis- pt has hx of recurrent sinusitis.  She reports this feels 'exactly the same' as her previous infxns.  Start Amox as she was unable to tolerate Bactrim after surgery.  Reviewed supportive care and red flags that should prompt return.  Pt expressed  understanding and is in agreement w/ plan.    Neena Rhymes, MD 06/10/2020

## 2020-06-10 NOTE — Progress Notes (Signed)
I have discussed the procedure for the virtual visit with the patient who has given consent to proceed with assessment and treatment.    Pt had a tummy tuck on 06/06/20, Had been on Bactrim could not complete.   Geannie Risen, CMA

## 2020-09-06 ENCOUNTER — Encounter: Payer: BC Managed Care – PPO | Admitting: Family Medicine

## 2020-09-16 ENCOUNTER — Other Ambulatory Visit: Payer: Self-pay

## 2020-09-16 ENCOUNTER — Ambulatory Visit (INDEPENDENT_AMBULATORY_CARE_PROVIDER_SITE_OTHER): Payer: BC Managed Care – PPO | Admitting: Family Medicine

## 2020-09-16 ENCOUNTER — Encounter: Payer: Self-pay | Admitting: Family Medicine

## 2020-09-16 VITALS — BP 117/70 | HR 84 | Temp 98.0°F | Resp 20 | Ht 61.0 in | Wt 136.0 lb

## 2020-09-16 DIAGNOSIS — M810 Age-related osteoporosis without current pathological fracture: Secondary | ICD-10-CM | POA: Diagnosis not present

## 2020-09-16 DIAGNOSIS — Z23 Encounter for immunization: Secondary | ICD-10-CM

## 2020-09-16 DIAGNOSIS — Z1159 Encounter for screening for other viral diseases: Secondary | ICD-10-CM | POA: Diagnosis not present

## 2020-09-16 DIAGNOSIS — Z0000 Encounter for general adult medical examination without abnormal findings: Secondary | ICD-10-CM

## 2020-09-16 DIAGNOSIS — H66001 Acute suppurative otitis media without spontaneous rupture of ear drum, right ear: Secondary | ICD-10-CM

## 2020-09-16 DIAGNOSIS — E663 Overweight: Secondary | ICD-10-CM | POA: Diagnosis not present

## 2020-09-16 LAB — LIPID PANEL
Cholesterol: 156 mg/dL (ref 0–200)
HDL: 62.4 mg/dL (ref >39.00)
LDL Cholesterol: 82 mg/dL (ref 0–99)
NonHDL: 93.26
Total CHOL/HDL Ratio: 2
Triglycerides: 57 mg/dL (ref 0.0–149.0)
VLDL: 11.4 mg/dL (ref 0.0–40.0)

## 2020-09-16 LAB — CBC WITH DIFFERENTIAL/PLATELET
Basophils Absolute: 0 10*3/uL (ref 0.0–0.1)
Basophils Relative: 0.6 % (ref 0.0–3.0)
Eosinophils Absolute: 0.1 10*3/uL (ref 0.0–0.7)
Eosinophils Relative: 1.2 % (ref 0.0–5.0)
HCT: 44.6 % (ref 36.0–46.0)
Hemoglobin: 15 g/dL (ref 12.0–15.0)
Lymphocytes Relative: 28.6 % (ref 12.0–46.0)
Lymphs Abs: 1.3 10*3/uL (ref 0.7–4.0)
MCHC: 33.6 g/dL (ref 30.0–36.0)
MCV: 90.4 fl (ref 78.0–100.0)
Monocytes Absolute: 0.3 10*3/uL (ref 0.1–1.0)
Monocytes Relative: 6.7 % (ref 3.0–12.0)
Neutro Abs: 2.9 10*3/uL (ref 1.4–7.7)
Neutrophils Relative %: 62.9 % (ref 43.0–77.0)
Platelets: 232 10*3/uL (ref 150.0–400.0)
RBC: 4.93 Mil/uL (ref 3.87–5.11)
RDW: 12.4 % (ref 11.5–15.5)
WBC: 4.7 10*3/uL (ref 4.0–10.5)

## 2020-09-16 LAB — HEPATIC FUNCTION PANEL
ALT: 16 U/L (ref 0–35)
AST: 19 U/L (ref 0–37)
Albumin: 4.2 g/dL (ref 3.5–5.2)
Alkaline Phosphatase: 68 U/L (ref 39–117)
Bilirubin, Direct: 0.1 mg/dL (ref 0.0–0.3)
Total Bilirubin: 0.3 mg/dL (ref 0.2–1.2)
Total Protein: 6 g/dL (ref 6.0–8.3)

## 2020-09-16 LAB — BASIC METABOLIC PANEL
BUN: 12 mg/dL (ref 6–23)
CO2: 29 mEq/L (ref 19–32)
Calcium: 8.8 mg/dL (ref 8.4–10.5)
Chloride: 106 mEq/L (ref 96–112)
Creatinine, Ser: 0.64 mg/dL (ref 0.40–1.20)
GFR: 93.62 mL/min (ref >60.00)
Glucose, Bld: 93 mg/dL (ref 70–99)
Potassium: 4.7 mEq/L (ref 3.5–5.1)
Sodium: 142 mEq/L (ref 135–145)

## 2020-09-16 LAB — TSH: TSH: 2.02 u[IU]/mL (ref 0.35–4.50)

## 2020-09-16 LAB — VITAMIN D 25 HYDROXY (VIT D DEFICIENCY, FRACTURES): VITD: 69.17 ng/mL (ref 30.00–100.00)

## 2020-09-16 MED ORDER — DENOSUMAB 60 MG/ML ~~LOC~~ SOSY
60.0000 mg | PREFILLED_SYRINGE | Freq: Once | SUBCUTANEOUS | Status: AC
  Administered 2020-09-16: 60 mg via SUBCUTANEOUS

## 2020-09-16 MED ORDER — AMOXICILLIN 875 MG PO TABS
875.0000 mg | ORAL_TABLET | Freq: Two times a day (BID) | ORAL | 0 refills | Status: DC
Start: 2020-09-16 — End: 2022-03-10

## 2020-09-16 NOTE — Assessment & Plan Note (Signed)
Pt reports recent DEXA at [facility].  Will attempt to get results.  Prolia injxn given today.  Check Vit D and replete prn.

## 2020-09-16 NOTE — Assessment & Plan Note (Signed)
Pt's PE WNL w/ exception of R otitis media.  UTD on pap, mammo, DEXA, COVID vaccines.  Flu shot and Tdap given today.  Check labs.  Anticipatory guidance provided.

## 2020-09-16 NOTE — Patient Instructions (Addendum)
Follow up in 1 year or as needed We'll notify you of your lab results and make any changes if needed Keep up the good work on healthy diet and regular exercise- you look great! Please have your GYN send copies of mammo and bone density START the Amoxicillin twice daily- take w/ food- for the ear infection Call with any questions or concerns Stay Safe!  Stay Healthy!

## 2020-09-16 NOTE — Addendum Note (Signed)
Addended by: Lana Fish on: 09/16/2020 08:25 AM   Modules accepted: Orders

## 2020-09-16 NOTE — Progress Notes (Signed)
Subjective:    Patient ID: Sandra Mora, female    DOB: Aug 17, 1956, 64 y.o.   MRN: [id number]  HPI CPE- UTD on pap, mammo, colonoscopy, COVID vaccines.  UTD on DEXA (done at [facility])  Due for Flu and Tdap.  Due for Prolia  Reviewed past medical, surgical, family and social histories.   Patient Care Team    Relationship Specialty Notifications Start End  Sheliah Hatch, MD PCP - General Family Medicine  05/12/12   [place], Amy, MD Consulting Physician Dermatology  10/26/16   Zelphia Cairo, MD Consulting Physician Obstetrics and Gynecology  10/26/16   Marzella Schlein., MD Consulting Physician Ophthalmology  10/26/16   Charna Elizabeth, MD Consulting Physician Gastroenterology  10/26/16     Health Maintenance  Topic Date Due  . Hepatitis C Screening  Never done  . HIV Screening  Never done  . INFLUENZA VACCINE  06/23/2020  . TETANUS/TDAP  10/26/2020  . MAMMOGRAM  07/10/2022  . COLONOSCOPY  06/27/2029  . COVID-19 Vaccine  Completed      Review of Systems Patient reports no vision/ hearing changes, adenopathy,fever, weight change,  persistant/recurrent hoarseness , swallowing issues, chest pain, palpitations, edema, persistant/recurrent cough, hemoptysis, dyspnea (rest/exertional/paroxysmal nocturnal), gastrointestinal bleeding (melena, rectal bleeding), abdominal pain, significant heartburn, bowel changes, GU symptoms (dysuria, hematuria, incontinence), Gyn symptoms (abnormal  bleeding, pain),  syncope, focal weakness, memory loss, numbness & tingling, skin/hair/nail changes, abnormal bruising or bleeding, anxiety, or depression.   R ear pain- started over the weekend after spending time outdoors [place] the fair.  Reports she has a hx of sinus problems on the R side.  Is taking daily Xyzal, nasal steroid, and using saline rinse.  This visit occurred during the SARS-CoV-2 public health emergency.  Safety protocols were in place, including screening questions prior to the visit,  additional usage of staff PPE, and extensive cleaning of exam room while observing appropriate contact time as indicated for disinfecting solutions.       Objective:   Physical Exam General Appearance:    Alert, cooperative, no distress, appears stated age  Head:    Normocephalic, without obvious abnormality, atraumatic  Eyes:    PERRL, conjunctiva/corneas clear, EOM's intact, fundi    benign, both eyes  Ears:    R TM bulging w/ visible fluid and distorted landmarks, L TM WNL  Nose:   Deferred due to COVID  Throat:   Neck:   Supple, symmetrical, trachea midline, no adenopathy;    Thyroid: no enlargement/tenderness/nodules  Back:     Symmetric, no curvature, ROM normal, no CVA tenderness  Lungs:     Clear to auscultation bilaterally, respirations unlabored  Chest Wall:    No tenderness or deformity   Heart:    Regular rate and rhythm, S1 and S2 normal, no murmur, rub   or gallop  Breast Exam:    Deferred to GYN  Abdomen:     Soft, non-tender, bowel sounds active all four quadrants,    no masses, no organomegaly  Genitalia:    Deferred to GYN  Rectal:    Extremities:   Extremities normal, atraumatic, no cyanosis or edema  Pulses:   2+ and symmetric all extremities  Skin:   Skin color, texture, turgor normal, no rashes or lesions  Lymph nodes:   Cervical, supraclavicular, and axillary nodes normal  Neurologic:   CNII-XII intact, normal strength, sensation and reflexes    throughout          Assessment &  Plan:  R otitis media- new.  Pt has hx of similar.  Start Amoxicillin twice daily.  Reviewed supportive care and red flags that should prompt return.  Pt expressed understanding and is in agreement w/ plan.

## 2020-09-17 LAB — HEPATITIS C ANTIBODY
Hepatitis C Ab: NONREACTIVE
SIGNAL TO CUT-OFF: 0.01 (ref <1.00)

## 2020-11-07 ENCOUNTER — Encounter: Payer: Self-pay | Admitting: *Deleted

## 2020-12-05 ENCOUNTER — Other Ambulatory Visit: Payer: BC Managed Care – PPO

## 2020-12-16 ENCOUNTER — Telehealth: Payer: Self-pay | Admitting: Family Medicine

## 2020-12-16 NOTE — Telephone Encounter (Signed)
Pt called in stating that she testing pos. For covid she wanted to know what to do for this, please advise.  Pt can be reached at the home #

## 2020-12-17 NOTE — Telephone Encounter (Signed)
I'm so sorry to hear that she isn't feeling well.  Thankfully she is vaccinated so hopefully this remains a mild illness.  She should drink plenty of fluids, rest, alternate tylenol/ibuprofen for fever, body aches, headache, sore throat.  OTC decongestant for nasal/sinus congestion.  Delsym or Robitussin for cough.  Vit D, Vit C, and Zinc will also help boost/stimulate immune system.

## 2020-12-17 NOTE — Telephone Encounter (Signed)
Patina provided pt with Dr. Rennis Golden recommendations.

## 2020-12-17 NOTE — Telephone Encounter (Signed)
Noted  

## 2021-01-01 ENCOUNTER — Telehealth: Payer: Self-pay

## 2021-01-01 ENCOUNTER — Telehealth: Payer: Self-pay | Admitting: Family Medicine

## 2021-01-01 NOTE — Telephone Encounter (Signed)
Pt called in asking when she is due for her prolia injections.   She also wanted to know if there is any prescription she needs to have on hand,she is going to be going to [place] and [place] in March.    Please advise   Pt can be reached at the home #

## 2021-01-01 NOTE — Telephone Encounter (Signed)
Called Medical Utilization line to get a PA for patient for her prolia injections. The rep gave an updated member insurance id number which is [id number]. Was put on hold for long time so I will try again to get the authorization for patients prolia injections.

## 2021-01-03 NOTE — Telephone Encounter (Signed)
There are no special meds that they need to take for their trip but it's always good to have tylenol, ibuprofen, imodium, benadryl, and anything else you might think you would need.  It's harder to buy these in a foreign country due to the language barrier

## 2021-01-03 NOTE — Telephone Encounter (Signed)
Called and spoke with patient about her prolia and informed her that I am trying my best to get it approved. Patient voiced understanding. She also wants to know if she and her husband should be taking any meds while on their trip to [place] and [place]. Please advise

## 2021-01-06 NOTE — Telephone Encounter (Signed)
Called and left a vm regarding concerns of meds to take while on vacation.

## 2021-01-07 NOTE — Telephone Encounter (Signed)
Called [employer] [phone] to obtain PA for Prolia per patient's "summary of Benefits";  I was instructed by the representative to initiate a PA thru Covermymeds. PA initiated, PA case ID# [id number], and the Key# [id number].  Will check on status in the next 24 hrs.

## 2021-01-10 ENCOUNTER — Telehealth: Payer: Self-pay | Admitting: Family Medicine

## 2021-01-10 NOTE — Telephone Encounter (Signed)
Spoke with [employer] about prolia PA for patient. Will resubmit PA on 01/14/21

## 2021-01-10 NOTE — Telephone Encounter (Signed)
Mrs. Deirdre Peer called to help you with a prior authorization for this patient.  She said that Sandra Mora had reached out to her.  Please call her back at [phone] and she said she can help you with this.

## 2021-01-14 ENCOUNTER — Telehealth: Payer: Self-pay | Admitting: Family Medicine

## 2021-01-14 NOTE — Telephone Encounter (Signed)
FYI  Patient called concerning her Prolia being denied - She left this number for Dr. Beverely Low - [phone] - Appeals Dept. At [facility] -

## 2021-01-15 DIAGNOSIS — K219 Gastro-esophageal reflux disease without esophagitis: Secondary | ICD-10-CM | POA: Insufficient documentation

## 2021-01-15 DIAGNOSIS — Z719 Counseling, unspecified: Secondary | ICD-10-CM | POA: Insufficient documentation

## 2021-01-15 DIAGNOSIS — Z1211 Encounter for screening for malignant neoplasm of colon: Secondary | ICD-10-CM | POA: Insufficient documentation

## 2021-01-15 NOTE — Telephone Encounter (Signed)
Will call the number to start the appeal process.

## 2021-01-15 NOTE — Telephone Encounter (Signed)
Thank you!  Yes- we need to appeal since she has been on this

## 2021-01-15 NOTE — Telephone Encounter (Signed)
Spoke with a representative in regards to patient prolia and was informed that another prior auth needs to be done as they could not find a denial on file. Rep will fax forms to proceed with the prior auth.

## 2021-01-22 ENCOUNTER — Encounter: Payer: Self-pay | Admitting: Family Medicine

## 2021-02-09 ENCOUNTER — Encounter: Payer: Self-pay | Admitting: Family Medicine

## 2021-03-13 ENCOUNTER — Encounter: Payer: Self-pay | Admitting: Family Medicine

## 2021-03-13 NOTE — Telephone Encounter (Signed)
Patient wanted to give you an update on her insurance.

## 2021-03-19 ENCOUNTER — Telehealth: Payer: Self-pay | Admitting: Family Medicine

## 2021-03-19 NOTE — Telephone Encounter (Signed)
Can you please update patient's insurance. I have nothing in the system

## 2021-03-19 NOTE — Telephone Encounter (Signed)
Patient has received letter from [facility] that her Prolia will not be covered.  Patient would like to know what can be done.  Please Advise.

## 2021-04-01 ENCOUNTER — Encounter: Payer: Self-pay | Admitting: Family Medicine

## 2021-04-07 NOTE — Telephone Encounter (Signed)
Patient's Prolia was approved, ordered and pt is schedule tomorrow 5/17 for nurse visit to get her Prolia shot.

## 2021-04-08 ENCOUNTER — Other Ambulatory Visit: Payer: Self-pay

## 2021-04-08 ENCOUNTER — Ambulatory Visit (INDEPENDENT_AMBULATORY_CARE_PROVIDER_SITE_OTHER): Payer: BC Managed Care – PPO

## 2021-04-08 DIAGNOSIS — M810 Age-related osteoporosis without current pathological fracture: Secondary | ICD-10-CM | POA: Diagnosis not present

## 2021-04-08 MED ORDER — DENOSUMAB 60 MG/ML ~~LOC~~ SOSY
60.0000 mg | PREFILLED_SYRINGE | Freq: Once | SUBCUTANEOUS | Status: AC
  Administered 2021-04-08: 60 mg via SUBCUTANEOUS

## 2021-04-08 NOTE — Progress Notes (Signed)
Patient presents to office to receive her prolia injection. Patient received 60 ml in her left arm and tolerated well. Patient will return in 6 months for her next prolia injection upon approval by her insurance.

## 2021-05-21 ENCOUNTER — Encounter: Payer: Self-pay | Admitting: *Deleted

## 2021-08-21 ENCOUNTER — Encounter: Payer: Self-pay | Admitting: Registered Nurse

## 2021-08-21 ENCOUNTER — Telehealth (INDEPENDENT_AMBULATORY_CARE_PROVIDER_SITE_OTHER): Payer: BC Managed Care – PPO | Admitting: Registered Nurse

## 2021-08-21 DIAGNOSIS — J019 Acute sinusitis, unspecified: Secondary | ICD-10-CM

## 2021-08-21 DIAGNOSIS — B9689 Other specified bacterial agents as the cause of diseases classified elsewhere: Secondary | ICD-10-CM | POA: Diagnosis not present

## 2021-08-21 MED ORDER — AMOXICILLIN-POT CLAVULANATE 875-125 MG PO TABS: 1.0000 | ORAL_TABLET | Freq: Two times a day (BID) | ORAL | 0 refills | Status: DC

## 2021-08-21 MED ORDER — FLUCONAZOLE 150 MG PO TABS: 150.0000 mg | ORAL_TABLET | Freq: Once | ORAL | 0 refills | Status: AC

## 2021-08-21 NOTE — Progress Notes (Signed)
Telemedicine Encounter- SOAP NOTE Established Patient  This video encounter was conducted with the patient's (or proxy's) verbal consent via audio telecommunications: yes/no: Yes Patient was instructed to have this encounter in a suitably private space; and to only have persons present to whom they give permission to participate. In addition, patient identity was confirmed by use of name plus two identifiers (DOB and address).  I discussed the limitations, risks, security and privacy concerns of performing an evaluation and management service by telephone and the availability of in person appointments. I also discussed with the patient that there may be a patient responsible charge related to this service. The patient expressed understanding and agreed to proceed.  I spent a total of 17 talking with the patient or their proxy.  Patient at home Provider in office  Participants: Jari Sportsman, NP and [name]  Chief Complaint  Patient presents with   Sinusitis    Patient states she has an annual sinus infection. Patient states she has some head congestion , nose congestion, right ear pain, runny nose and a cough. Patient states she has took a covid test this morning and had a negative test.    Subjective   Sandra Mora is a 65 y.o. established patient. video visit today for sinusitis  HPI Hx of crush injury to face Hx of seasonal allergies Usually gets a bad sinus infection once a year, usually around the fall allergy season Negative at home covid test Vaccinated 2x, and has had COVID 3x.  Maxillary R sinus, R ear pressure, congestion, pnd with cough.  Low grade temp, 99.2  Denies nvd, chills, sweats, chest pain, palpitations  Patient Active Problem List   Diagnosis Date Noted   Gastroesophageal reflux disease 01/15/2021   Colon cancer screening 01/15/2021   Osteoporosis    Allergic rhinitis 08/06/2014   Physical exam, annual 07/27/2012   Migraine     Past Medical  History:  Diagnosis Date   Allergy    seasonal   Migraine    Osteoporosis    Spine -3 (2013)    Current Outpatient Medications  Medication Sig Dispense Refill   amoxicillin-clavulanate (AUGMENTIN) 875-125 MG tablet Take 1 tablet by mouth 2 (two) times daily. 20 tablet 0   amoxicillin (AMOXIL) 875 MG tablet Take 1 tablet (875 mg total) by mouth 2 (two) times daily. (Patient not taking: Reported on 08/21/2021) 20 tablet 0   Aspirin (ANACIN PO) Take by mouth as needed.   (Patient not taking: No sig reported)     b complex vitamins tablet Take 1 tablet by mouth daily.   (Patient not taking: No sig reported)     Calcium Carbonate-Vitamin D (CALCIUM 600 + D PO) Take by mouth daily.   (Patient not taking: No sig reported)     Cholecalciferol (VITAMIN D) 2000 UNITS CAPS Take by mouth daily.   (Patient not taking: No sig reported)     fluticasone (FLONASE) 50 MCG/ACT nasal spray Place into the nose.     magnesium oxide (MAG-OX) 400 MG tablet Take 400 mg by mouth daily. (Patient not taking: No sig reported)     metaxalone (SKELAXIN) 800 MG tablet Take 800 mg by mouth 3 (three) times daily. (Patient not taking: No sig reported)     Multiple Vitamin (MULTIVITAMIN) tablet Take 1 tablet by mouth daily.   (Patient not taking: No sig reported)     Omega-3 Fatty Acids (FISH OIL PO) Take by mouth daily.   (Patient  not taking: No sig reported)     ondansetron (ZOFRAN) 4 MG tablet Take by mouth. (Patient not taking: No sig reported)     Probiotic Product (PROBIOTIC DAILY PO) Take by mouth. (Patient not taking: No sig reported)     Pseudoephedrine HCl (SUDAFED PO) Take by mouth daily as needed.   (Patient not taking: No sig reported)     sulfamethoxazole-trimethoprim (BACTRIM DS) 800-160 MG tablet Take 1 tablet by mouth 2 (two) times daily. (Patient not taking: No sig reported)     SUMAtriptan (IMITREX) 50 MG tablet TAKE 1 TABLET AT ONSET OF HEADACHE, REPEAT IN 2 HRS IF HEADACHE PERSISTS **MAX 2 TAB IN 24 HRS**  (Patient not taking: No sig reported) 10 tablet 11   VITAMIN K PO Take by mouth. (Patient not taking: No sig reported)     No current facility-administered medications for this visit.    Allergies  Allergen Reactions   Codeine Nausea And Vomiting    Social History   Socioeconomic History   Marital status: Married    Spouse name: Not on file   Number of children: Not on file   Years of education: Not on file   Highest education level: Not on file  Occupational History   Occupation: occupational therapist    Employer: [employer]  Tobacco Use   Smoking status: Never   Smokeless tobacco: Never  Substance and Sexual Activity   Alcohol use: No   Drug use: No   Sexual activity: Not on file  Other Topics Concern   Not on file  Social History Narrative   Not on file   Social Determinants of Health   Financial Resource Strain: Not on file  Food Insecurity: Not on file  Transportation Needs: Not on file  Physical Activity: Not on file  Stress: Not on file  Social Connections: Not on file  Intimate Partner Violence: Not on file    Review of Systems  Constitutional:  Positive for fever. Negative for chills, diaphoresis, malaise/fatigue and weight loss.  HENT:  Positive for congestion, sinus pain and sore throat.   Eyes: Negative.   Respiratory:  Positive for cough.   Cardiovascular: Negative.   Gastrointestinal: Negative.   Genitourinary: Negative.   Musculoskeletal: Negative.   Skin: Negative.   Neurological: Negative.   Endo/Heme/Allergies: Negative.   Psychiatric/Behavioral: Negative.    All other systems reviewed and are negative.  Objective   Vitals as reported by the patient: There were no vitals filed for this visit.  Sandra Mora was seen today for sinusitis.  Diagnoses and all orders for this visit:  Acute bacterial sinusitis -     amoxicillin-clavulanate (AUGMENTIN) 875-125 MG tablet; Take 1 tablet by mouth 2 (two) times daily. -     fluconazole  (DIFLUCAN) 150 MG tablet; Take 1 tablet (150 mg total) by mouth once for 1 dose. For yeast infection   PLAN Augmentin po bid x 10 days as above. Reviewed nonpharm and supportive care with pt who voices understanding. Return precautions reviewed. Giving diflucan as pt reports candidal infections when using abx. If symptoms persist, will need in office visit Patient encouraged to call clinic with any questions, comments, or concerns.  I discussed the assessment and treatment plan with the patient. The patient was provided an opportunity to ask questions and all were answered. The patient agreed with the plan and demonstrated an understanding of the instructions.   The patient was advised to call back or seek an in-person evaluation if  the symptoms worsen or if the condition fails to improve as anticipated.  I provided 17 minutes of non-face-to-face time during this encounter.  Janeece Agee, NP

## 2021-08-21 NOTE — Patient Instructions (Signed)
° ° ° °  If you have lab work done today you will be contacted with your lab results within the next 2 weeks.  If you have not heard from us then please contact us. The fastest way to get your results is to register for My Chart. ° ° °IF you received an x-ray today, you will receive an invoice from [facility]. Please contact [facility] at [phone] with questions or concerns regarding your invoice.  ° °IF you received labwork today, you will receive an invoice from [facility]. Please contact [facility] at [phone] with questions or concerns regarding your invoice.  ° °Our billing staff will not be able to assist you with questions regarding bills from these companies. ° °You will be contacted with the lab results as soon as they are available. The fastest way to get your results is to activate your My Chart account. Instructions are located on the last page of this paperwork. If you have not heard from us regarding the results in 2 weeks, please contact this office. °  ° ° ° °

## 2021-09-05 ENCOUNTER — Encounter: Payer: Self-pay | Admitting: Family Medicine

## 2021-09-11 ENCOUNTER — Encounter: Payer: Self-pay | Admitting: Family Medicine

## 2021-10-06 ENCOUNTER — Encounter: Payer: Self-pay | Admitting: Family Medicine

## 2021-11-13 ENCOUNTER — Telehealth: Payer: Self-pay

## 2021-11-13 NOTE — Telephone Encounter (Signed)
Received fax from [facility] for Prolia approval. Approved through 11/12/2021-11/22/2022 EOC# [id number]

## 2021-11-18 ENCOUNTER — Ambulatory Visit: Payer: Medicare PPO

## 2021-11-20 ENCOUNTER — Ambulatory Visit (INDEPENDENT_AMBULATORY_CARE_PROVIDER_SITE_OTHER): Payer: Medicare PPO | Admitting: Family Medicine

## 2021-11-20 DIAGNOSIS — M810 Age-related osteoporosis without current pathological fracture: Secondary | ICD-10-CM

## 2021-11-20 MED ORDER — DENOSUMAB 60 MG/ML ~~LOC~~ SOSY
60.0000 mg | PREFILLED_SYRINGE | Freq: Once | SUBCUTANEOUS | Status: AC
  Administered 2021-11-20: 16:00:00 60 mg via SUBCUTANEOUS

## 2021-11-20 NOTE — Progress Notes (Addendum)
Pt was given Prolia without concerns pt reports well tolerated previously   The above order was mine.  Neena Rhymes, MD

## 2022-02-22 ENCOUNTER — Encounter: Payer: Self-pay | Admitting: Family Medicine

## 2022-03-10 ENCOUNTER — Encounter: Payer: Self-pay | Admitting: Family Medicine

## 2022-03-10 ENCOUNTER — Telehealth (INDEPENDENT_AMBULATORY_CARE_PROVIDER_SITE_OTHER): Payer: Medicare PPO | Admitting: Family Medicine

## 2022-03-10 DIAGNOSIS — B9689 Other specified bacterial agents as the cause of diseases classified elsewhere: Secondary | ICD-10-CM | POA: Diagnosis not present

## 2022-03-10 DIAGNOSIS — J019 Acute sinusitis, unspecified: Secondary | ICD-10-CM

## 2022-03-10 MED ORDER — PREDNISONE 20 MG PO TABS: 40.0000 mg | ORAL_TABLET | Freq: Every day | ORAL | 0 refills | Status: AC

## 2022-03-10 MED ORDER — AMOXICILLIN-POT CLAVULANATE 875-125 MG PO TABS: 1.0000 | ORAL_TABLET | Freq: Two times a day (BID) | ORAL | 0 refills | Status: DC

## 2022-03-10 NOTE — Progress Notes (Signed)
? ? ?MyChart Video Visit ? ? ? ?Virtual Visit via Video Note  ? ?This visit type was conducted due to national recommendations for restrictions regarding the COVID-19 Pandemic (e.g. social distancing) in an effort to limit this patient's exposure and mitigate transmission in our community. This patient is at least at moderate risk for complications without adequate follow up. This format is felt to be most appropriate for this patient at this time. Physical exam was limited by quality of the video and audio technology used for the visit. CMA was able to get the patient set up on a video visit. ? ?Patient location: Home. Patient and provider in visit ?Provider location: Office ? ?I discussed the limitations of evaluation and management by telemedicine and the availability of in person appointments. The patient expressed understanding and agreed to proceed. ? ?Visit Date: 03/10/2022 ? ?Today's healthcare provider: Wellington Hampshire, MD  ? ? ? ?Subjective:  ? ? Patient ID: Sandra Mora, female    DOB: 12-24-1955, 66 y.o.   MRN: [id number] ? ?Chief Complaint  ?Patient presents with  ? Headache  ? Sinusitis  ?  Face, teeth and neck pain  ?Has taking Sudafed, ibuprofen, Mucinex, vitamin C  ? Cough  ?  Productive cough ?Sx started last Monday after keeping grandkids that have sinus infections  ? ? ?HPI ?Chief complaint: sinus infection ?Symptom onset: 1 wk ?Pertinent positives: ha, face and teeth and neck pain.  Cough.  Grandchildren have sinus infections.  H/o crush injury to face in past  all on R side now. ?Pertinent negatives: no f/c.  No sob ?Treatments tried: OTC ?Vaccine status: n/a ?Sick exposure: grands  ? ?Past Medical History:  ?Diagnosis Date  ? Allergy   ? seasonal  ? Migraine   ? Osteoporosis   ? Spine -3 (2013)  ? ? ?Past Surgical History:  ?Procedure Laterality Date  ? ABDOMINAL HYSTERECTOMY    ? COSMETIC SURGERY    ? ? ?Outpatient Medications Prior to Visit  ?Medication Sig Dispense Refill  ? Calcium  Carbonate-Vitamin D (CALCIUM 600 + D PO) Take by mouth daily.    ? Cholecalciferol (VITAMIN D) 2000 UNITS CAPS Take 4,000 Units by mouth daily.    ? fluticasone (FLONASE) 50 MCG/ACT nasal spray Place into the nose.    ? magnesium oxide (MAG-OX) 400 MG tablet Take 400 mg by mouth daily.    ? Multiple Vitamin (MULTIVITAMIN) tablet Take 1 tablet by mouth daily.    ? Omega-3 Fatty Acids (FISH OIL PO) Take by mouth daily.    ? Probiotic Product (PROBIOTIC DAILY PO) Take by mouth.    ? Pseudoephedrine HCl (SUDAFED PO) Take by mouth daily as needed.    ? pyridOXINE (VITAMIN B-6) 100 MG tablet Vitamin B6    ? SUMAtriptan (IMITREX) 50 MG tablet TAKE 1 TABLET AT ONSET OF HEADACHE, REPEAT IN 2 HRS IF HEADACHE PERSISTS **MAX 2 TAB IN 24 HRS** 10 tablet 11  ? amoxicillin-clavulanate (AUGMENTIN) 875-125 MG tablet Take 1 tablet by mouth 2 (two) times daily. 20 tablet 0  ? amoxicillin (AMOXIL) 875 MG tablet Take 1 tablet (875 mg total) by mouth 2 (two) times daily. (Patient not taking: Reported on 08/21/2021) 20 tablet 0  ? Aspirin (ANACIN PO) Take by mouth as needed.   (Patient not taking: Reported on 06/10/2020)    ? b complex vitamins tablet Take 1 tablet by mouth daily.   (Patient not taking: Reported on 06/10/2020)    ? metaxalone (SKELAXIN)  800 MG tablet Take 800 mg by mouth 3 (three) times daily. (Patient not taking: Reported on 09/16/2020)    ? ondansetron (ZOFRAN) 4 MG tablet Take by mouth. (Patient not taking: No sig reported)    ? sulfamethoxazole-trimethoprim (BACTRIM DS) 800-160 MG tablet Take 1 tablet by mouth 2 (two) times daily. (Patient not taking: Reported on 06/10/2020)    ? VITAMIN K PO Take by mouth. (Patient not taking: Reported on 06/10/2020)    ? ?No facility-administered medications prior to visit.  ? ? ?Allergies  ?Allergen Reactions  ? Codeine Nausea And Vomiting and Other (See Comments)  ? ? ? ?   ?Objective:  ?  ? ?Physical Exam  ?Vitals and nursing note reviewed.  ?Constitutional:   ?   General:  is not in  acute distress. ?   Appearance: Normal appearance.  ?HENT:  ?   Head: Normocephalic.  ?Pulmonary:  ?   Effort: No respiratory distress.  ?Skin: ?   General: Skin is dry.  ?   Coloration: Skin is not pale.  ?Neurological:  ?   Mental Status: Pt is alert and oriented to person, place, and time.  ?Psychiatric:     ?   Mood and Affect: Mood normal.  ? ?There were no vitals taken for this visit. ? ?Wt Readings from Last 3 Encounters:  ?09/16/20 136 lb (61.7 kg)  ?01/13/19 136 lb 6 oz (61.9 kg)  ?11/25/17 138 lb (62.6 kg)  ? ? ?   ?Assessment & Plan:  ? ?Problem List Items Addressed This Visit   ?None ?Visit Diagnoses   ? ? Acute bacterial sinusitis    -  Primary  ? Relevant Medications  ? amoxicillin-clavulanate (AUGMENTIN) 875-125 MG tablet  ? predniSONE (DELTASONE) 20 MG tablet  ? ?  ? R maxillary sinusitis-augemntin, prednisone. ? ?Meds ordered this encounter  ?Medications  ? amoxicillin-clavulanate (AUGMENTIN) 875-125 MG tablet  ?  Sig: Take 1 tablet by mouth 2 (two) times daily.  ?  Dispense:  20 tablet  ?  Refill:  0  ? predniSONE (DELTASONE) 20 MG tablet  ?  Sig: Take 2 tablets (40 mg total) by mouth daily with breakfast for 5 days.  ?  Dispense:  10 tablet  ?  Refill:  0  ? ? ?I discussed the assessment and treatment plan with the patient. The patient was provided an opportunity to ask questions and all were answered. The patient agreed with the plan and demonstrated an understanding of the instructions. ?  ?The patient was advised to call back or seek an in-person evaluation if the symptoms worsen or if the condition fails to improve as anticipated. ? ?I provided 15 minutes of face-to-face time during this encounter. ? ? ?Wellington Hampshire, MD ?[place] ?[phone] (phone) ?[phone] (fax) ? ?[facility]  ?

## 2022-03-10 NOTE — Patient Instructions (Signed)
Meds have been sent the the pharmacy ?You can take tylenol for pain/fevers ?If worsening symptoms, let us know or go to the Emergency room  ? ?Can stop the prednisone at any time.  ?

## 2022-04-15 ENCOUNTER — Other Ambulatory Visit: Payer: Self-pay | Admitting: Radiology

## 2022-04-15 DIAGNOSIS — G43909 Migraine, unspecified, not intractable, without status migrainosus: Secondary | ICD-10-CM

## 2022-05-20 ENCOUNTER — Ambulatory Visit (INDEPENDENT_AMBULATORY_CARE_PROVIDER_SITE_OTHER): Payer: Medicare PPO | Admitting: Family Medicine

## 2022-05-20 ENCOUNTER — Encounter: Payer: Self-pay | Admitting: Family Medicine

## 2022-05-20 DIAGNOSIS — M810 Age-related osteoporosis without current pathological fracture: Secondary | ICD-10-CM | POA: Diagnosis not present

## 2022-05-20 MED ORDER — DENOSUMAB 60 MG/ML ~~LOC~~ SOSY
60.0000 mg | PREFILLED_SYRINGE | Freq: Once | SUBCUTANEOUS | Status: AC
  Administered 2022-05-20: 60 mg via SUBCUTANEOUS

## 2022-05-20 NOTE — Progress Notes (Signed)
Sandra Mora is a 66 y.o. female presents to the office today for prolia :njections, per physician's orders.  Prolia  (med),  IM  (route) was administered right deltoid  (location) today. Patient tolerated injection. Patient due for follow up labs/provider appt: Yes. Date du appt made Yes Patient next injection due: 11/19/2022, appt made Yes  Rockney Ghee

## 2022-06-25 ENCOUNTER — Encounter: Payer: Self-pay | Admitting: Family Medicine

## 2022-06-25 ENCOUNTER — Other Ambulatory Visit: Payer: Self-pay

## 2022-06-25 ENCOUNTER — Ambulatory Visit: Payer: Medicare PPO | Admitting: Family Medicine

## 2022-06-25 VITALS — BP 132/78 | HR 93 | Temp 98.3°F | Resp 18 | Ht 61.0 in | Wt 136.6 lb

## 2022-06-25 DIAGNOSIS — Z823 Family history of stroke: Secondary | ICD-10-CM

## 2022-06-25 DIAGNOSIS — Q250 Patent ductus arteriosus: Secondary | ICD-10-CM | POA: Diagnosis not present

## 2022-06-25 DIAGNOSIS — F419 Anxiety disorder, unspecified: Secondary | ICD-10-CM | POA: Diagnosis not present

## 2022-06-25 MED ORDER — BUPROPION HCL 75 MG PO TABS: 75.0000 mg | ORAL_TABLET | Freq: Two times a day (BID) | ORAL | 3 refills | Status: DC

## 2022-06-25 NOTE — Progress Notes (Signed)
   Subjective:    Patient ID: Sandra Mora, female    DOB: [birth year], 66 y.o.   MRN: [id number]  HPI Anxiety- strong family hx of anxiety and pt knows she has had anxiety for years.  'i just can't relax.  I just can't enjoy'.  Pt reports there are times she is unable to sit down bc she is so anxious.  Has tried OTC supplements w/o improvement.  Denies any signs of depression.  There is also a strong family hx of ADHD and she isn't sure if this is part of the picture.  Pt is very anxious about taking medication.    Family hx of CVA- sister has recently had multiple embolic strokes.  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  Sister's doctor told her this is dangerous and she needs cardiac workup.     Review of Systems For ROS see HPI     Objective:   Physical Exam Vitals reviewed.  Constitutional:      General: She is not in acute distress.    Appearance: Normal appearance. She is well-developed. She is not ill-appearing.  HENT:     Head: Normocephalic and atraumatic.  Eyes:     Conjunctiva/sclera: Conjunctivae normal.     Pupils: Pupils are equal, round, and reactive to light.  Neck:     Thyroid: No thyromegaly.  Cardiovascular:     Rate and Rhythm: Normal rate and regular rhythm.     Heart sounds: Normal heart sounds. No murmur heard. Pulmonary:     Effort: Pulmonary effort is normal. No respiratory distress.     Breath sounds: Normal breath sounds.  Abdominal:     General: There is no distension.     Palpations: Abdomen is soft.     Tenderness: There is no abdominal tenderness.  Musculoskeletal:     Cervical back: Normal range of motion and neck supple.     Right lower leg: No edema.     Left lower leg: No edema.  Lymphadenopathy:     Cervical: No cervical adenopathy.  Skin:    General: Skin is warm and dry.  Neurological:     General: No focal deficit present.     Mental Status: She is alert and oriented to person, place, and time.  Psychiatric:      Comments: Anxious, rapid- almost pressured- speech           Assessment & Plan:

## 2022-06-25 NOTE — Patient Instructions (Signed)
Follow up in 4-6 weeks to recheck anxiety We'll call you with your cardiology appt START the Wellbutrin twice daily Make sure you take time for you!!! Call with any questions or concerns Hang in there!!!

## 2022-06-28 DIAGNOSIS — Z823 Family history of stroke: Secondary | ICD-10-CM | POA: Insufficient documentation

## 2022-06-28 DIAGNOSIS — Q250 Patent ductus arteriosus: Secondary | ICD-10-CM | POA: Insufficient documentation

## 2022-06-28 DIAGNOSIS — F419 Anxiety disorder, unspecified: Secondary | ICD-10-CM | POA: Insufficient documentation

## 2022-06-28 NOTE — Assessment & Plan Note (Signed)
New to provider, ongoing for pt.  She reports she has had anxiety 'for years' but it has recently worsened and she is unable to relax.  Finds herself unable to sit down due to anxiety.  States no sxs of depression, just anxiety.  She feels this is likely related to underlying and untreated ADHD.  Will start low dose Wellbutrin in hopes of treating both anxiety and ADHD.  Pt expressed understanding and is in agreement w/ plan.

## 2022-06-28 NOTE — Assessment & Plan Note (Signed)
New.  Pt reports she had a vein ablation done a number of years ago and 'after they injected the foam I developed blurry vision and a headache'.  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  She forgot all about this until her sister had her recent strokes.  Will refer to Cardiology for complete work up.  Pt expressed understanding and is in agreement w/ plan.

## 2022-06-28 NOTE — Assessment & Plan Note (Signed)
New.  Sister was told she had embolic strokes due to a PDA.  Was told she should be checked for this.  Will refer to Cards for complete evaluation.

## 2022-07-16 NOTE — Progress Notes (Signed)
Cardiology Office Note:   Date:  07/17/2022  NAME:  Sandra Mora    MRN: [id number] DOB:  May 25, 1956   PCP:  Midge Minium, MD  Cardiologist:  None  Electrophysiologist:  None   Referring MD: Midge Minium, MD   Chief Complaint  Patient presents with   New Patient (Initial Visit)         History of Present Illness:   Sandra Mora is a 66 y.o. female with a hx of anxiety who is being seen today for the evaluation of PDA at the request of Midge Minium, MD. she reports she had a vein procedure done about 3 years ago.  Apparently after the procedure she became a little confused.  She was told by the doctor that she had a patent ductus arteriosus.  This has been a surprise to her.  She was not diagnosed with this in childhood.  She had no evidence of congenital heart disease.  She is also concerned because her sister has had several strokes.  Her sister has been diagnosed with atrial fibrillation and what appears to be vascular dementia.  She does have an Apple watch.  She has not had any A-fib.  Her EKG is normal.  She can exercise without limitations.  She reports no chest pain or trouble breathing.  Lab work from 2 years ago shows a total cholesterol 156, HDL 62, LDL 82, triglycerides 57.  CV exam is normal.  She does not smoke.  No alcohol or drug use.  Her parents developed heart disease in their 38s.  She is married.  She has 2 children.  She is several grandchildren.  She is really without complaints but does request further CV screening.  We discussed calcium scoring and she is interested.  We did discuss that she is unlikely to have a patent ductus arteriosus.  Past Medical History: Past Medical History:  Diagnosis Date   Allergy    seasonal   Migraine    Osteoporosis    Spine -3 (2013)    Past Surgical History: Past Surgical History:  Procedure Laterality Date   ABDOMINAL HYSTERECTOMY     COSMETIC SURGERY      Current Medications: Current Meds   Medication Sig   buPROPion (WELLBUTRIN) 75 MG tablet Take 1 tablet (75 mg total) by mouth 2 (two) times daily.   Calcium Carbonate-Vitamin D (CALCIUM 600 + D PO) Take by mouth daily.   Cholecalciferol (VITAMIN D) 2000 UNITS CAPS Take 4,000 Units by mouth daily.   magnesium oxide (MAG-OX) 400 MG tablet Take 400 mg by mouth daily.   Multiple Vitamin (MULTIVITAMIN) tablet Take 1 tablet by mouth daily.   Omega-3 Fatty Acids (FISH OIL PO) Take 2 Capfuls by mouth daily.   Probiotic Product (PROBIOTIC DAILY PO) Take by mouth.   pyridOXINE (VITAMIN B-6) 100 MG tablet Vitamin B6   SUMAtriptan (IMITREX) 50 MG tablet TAKE 1 TABLET FOR MIGRAINE RELIEF. MAY REPEAT EVERY 2 HOURS. MAX 200MG/DAY.     Allergies:    Codeine   Social History: Social History   Socioeconomic History   Marital status: Married    Spouse name: Not on file   Number of children: 2   Years of education: Not on file   Highest education level: Not on file  Occupational History   Occupation: occupational therapist    Employer: [employer]  Tobacco Use   Smoking status: Never   Smokeless tobacco: Never  Substance and Sexual  Activity   Alcohol use: No   Drug use: No   Sexual activity: Not on file  Other Topics Concern   Not on file  Social History Narrative   Not on file   Social Determinants of Health   Financial Resource Strain: Not on file  Food Insecurity: Not on file  Transportation Needs: Not on file  Physical Activity: Not on file  Stress: Not on file  Social Connections: Not on file     Family History: The patient's family history includes Arrhythmia in her sister; Heart attack in her father; Heart disease in her father and mother; Stroke in her sister.  ROS:   All other ROS reviewed and negative. Pertinent positives noted in the HPI.     EKGs/Labs/Other Studies Reviewed:   The following studies were personally reviewed by me today:  EKG:  EKG is ordered today.  The ekg ordered today  demonstrates normal sinus rhythm heart rate 95, no acute ischemic changes or evidence of infarction, and was personally reviewed by me.   Recent Labs: No results found for requested labs within last 365 days.   Recent Lipid Panel    Component Value Date/Time   CHOL 156 09/16/2020 0815   TRIG 57.0 09/16/2020 0815   HDL 62.40 09/16/2020 0815   CHOLHDL 2 09/16/2020 0815   VLDL 11.4 09/16/2020 0815   LDLCALC 82 09/16/2020 0815    Physical Exam:   VS:  BP 130/80 (BP Location: Left Arm, Patient Position: Sitting, Cuff Size: Normal)   Pulse 95   Ht $R'5\' 2"'yl$  (1.575 m)   Wt 133 lb (60.3 kg)   BMI 24.33 kg/m    Wt Readings from Last 3 Encounters:  07/17/22 133 lb (60.3 kg)  06/25/22 136 lb 9.6 oz (62 kg)  09/16/20 136 lb (61.7 kg)    General: Well nourished, well developed, in no acute distress Head: Atraumatic, normal size  Eyes: PEERLA, EOMI  Neck: Supple, no JVD Endocrine: No thryomegaly Cardiac: Normal S1, S2; RRR; no murmurs, rubs, or gallops Lungs: Clear to auscultation bilaterally, no wheezing, rhonchi or rales  Abd: Soft, nontender, no hepatomegaly  Ext: No edema, pulses 2+ Musculoskeletal: No deformities, BUE and BLE strength normal and equal Skin: Warm and dry, no rashes   Neuro: Alert and oriented to person, place, time, and situation, CNII-XII grossly intact, no focal deficits  Psych: Normal mood and affect   ASSESSMENT:   Sandra Mora is a 66 y.o. female who presents for the following: 1. PDA (patent ductus arteriosus)   2. Mixed hyperlipidemia     PLAN:   1. PDA (patent ductus arteriosus) -Highly unlikely she has this diagnosis.  Apparently there was concern for possible TIA after a vascular procedure in her veins.  The physician may have met patent foramen ovale.  Regardless she has no murmur.  Her CV exam is normal.  There is no signs of heart failure.  We will plan to set her up for an echocardiogram to ensure that her heart is structurally normal.  2. Mixed  hyperlipidemia -LDL cholesterol is acceptable.  Strong family history of heart disease.  Her sister also had atrial fibrillation.  We will set her up for a calcium score for further cardiovascular disease risk stratification.  She will see me back as needed based on the results of the scan.  We did discuss that atrial fibrillation can be hereditary but likely related to her sisters age of 70 years.  Unfortunately her sister now  has vascular dementia from multiple cardioembolic strokes.  Hopefully she can avoid this.   Disposition: Return if symptoms worsen or fail to improve.  Medication Adjustments/Labs and Tests Ordered: Current medicines are reviewed at length with the patient today.  Concerns regarding medicines are outlined above.  Orders Placed This Encounter  Procedures   CT CARDIAC SCORING (SELF PAY ONLY)   EKG 12-Lead   ECHOCARDIOGRAM COMPLETE   No orders of the defined types were placed in this encounter.   Patient Instructions  Medication Instructions:  The current medical regimen is effective;  continue present plan and medications.  *If you need a refill on your cardiac medications before your next appointment, please call your pharmacy*   Testing/Procedures:  CALCIUM SCORE  Echocardiogram - Your physician has requested that you have an echocardiogram. Echocardiography is a painless test that uses sound waves to create images of your heart. It provides your doctor with information about the size and shape of your heart and how well your heart's chambers and valves are working. This procedure takes approximately one hour. There are no restrictions for this procedure.     Follow-Up: At [facility], you and your health needs are our priority.  As part of our continuing mission to provide you with exceptional heart care, we have created designated Provider Care Teams.  These Care Teams include your primary Cardiologist (physician) and Advanced Practice Providers (APPs -   Physician Assistants and Nurse Practitioners) who all work together to provide you with the care you need, when you need it.  We recommend signing up for the patient portal called "MyChart".  Sign up information is provided on this After Visit Summary.  MyChart is used to connect with patients for Virtual Visits (Telemedicine).  Patients are able to view lab/test results, encounter notes, upcoming appointments, etc.  Non-urgent messages can be sent to your provider as well.   To learn more about what you can do with MyChart, go to [email or web address].    Your next appointment:   As needed  The format for your next appointment:   In Person  Provider:   Eleonore Chiquito, MD             Signed, Addison Naegeli. Audie Box, MD, [place]  [address] [place], [place] [address] [phone]  07/17/2022 8:38 AM

## 2022-07-17 ENCOUNTER — Ambulatory Visit: Payer: Medicare PPO | Admitting: Cardiovascular Disease

## 2022-07-17 ENCOUNTER — Encounter: Payer: Self-pay | Admitting: Cardiovascular Disease

## 2022-07-17 VITALS — BP 130/80 | HR 95 | Ht 62.0 in | Wt 133.0 lb

## 2022-07-17 DIAGNOSIS — E782 Mixed hyperlipidemia: Secondary | ICD-10-CM

## 2022-07-17 DIAGNOSIS — Q250 Patent ductus arteriosus: Secondary | ICD-10-CM

## 2022-07-17 NOTE — Patient Instructions (Signed)
Medication Instructions:  The current medical regimen is effective;  continue present plan and medications.  *If you need a refill on your cardiac medications before your next appointment, please call your pharmacy*   Testing/Procedures: CALCIUM SCORE   Echocardiogram - Your physician has requested that you have an echocardiogram. Echocardiography is a painless test that uses sound waves to create images of your heart. It provides your doctor with information about the size and shape of your heart and how well your heart's chambers and valves are working. This procedure takes approximately one hour. There are no restrictions for this procedure.     Follow-Up: At [facility], you and your health needs are our priority.  As part of our continuing mission to provide you with exceptional heart care, we have created designated Provider Care Teams.  These Care Teams include your primary Cardiologist (physician) and Advanced Practice Providers (APPs -  Physician Assistants and Nurse Practitioners) who all work together to provide you with the care you need, when you need it.  We recommend signing up for the patient portal called "MyChart".  Sign up information is provided on this After Visit Summary.  MyChart is used to connect with patients for Virtual Visits (Telemedicine).  Patients are able to view lab/test results, encounter notes, upcoming appointments, etc.  Non-urgent messages can be sent to your provider as well.   To learn more about what you can do with MyChart, go to [email or web address].    Your next appointment:   As needed  The format for your next appointment:   In Person  Provider:   Montgomery O'Neal, MD          

## 2022-08-03 ENCOUNTER — Ambulatory Visit: Payer: Medicare PPO | Admitting: Family Medicine

## 2022-08-03 ENCOUNTER — Encounter: Payer: Self-pay | Admitting: Family Medicine

## 2022-08-03 ENCOUNTER — Other Ambulatory Visit: Payer: Self-pay

## 2022-08-03 VITALS — BP 124/70 | HR 89 | Temp 97.9°F | Resp 18 | Ht 62.0 in | Wt 133.4 lb

## 2022-08-03 DIAGNOSIS — F419 Anxiety disorder, unspecified: Secondary | ICD-10-CM

## 2022-08-03 NOTE — Assessment & Plan Note (Signed)
Pt's anxiety is much better since starting Wellbutrin 75mg  BID.  No changes at this time.  Will reassess this spring after her SAD window has passed.  Will follow.

## 2022-08-03 NOTE — Progress Notes (Signed)
   Subjective:    Patient ID: Sandra Mora, female    DOB: 03/21/1956, 66 y.o.   MRN: [id number]  HPI Anxiety- pt was started on Wellbutrin 75mg  BID at last visit.  Pt reports medication has been helpful- racing heart has improved.  Had some mild GI side effects initially but this improved.  'i'm not freaking out as much'.  Less ruminating, sleeping better.     Review of Systems For ROS see HPI     Objective:   Physical Exam Vitals reviewed.  Constitutional:      General: She is not in acute distress.    Appearance: Normal appearance. She is not ill-appearing.  HENT:     Head: Normocephalic and atraumatic.  Skin:    General: Skin is warm and dry.  Neurological:     General: No focal deficit present.     Mental Status: She is alert and oriented to person, place, and time.  Psychiatric:        Mood and Affect: Mood normal.        Behavior: Behavior normal.        Thought Content: Thought content normal.           Assessment & Plan:

## 2022-08-03 NOTE — Patient Instructions (Signed)
Schedule your complete physical in 3-4 months Continue the Wellbutrin twice daily I'm so glad that things are improving with your anxiety! Call with any questions or concerns Stay Safe!  Stay Healthy! Happy Fall!!!

## 2022-08-04 ENCOUNTER — Ambulatory Visit (HOSPITAL_COMMUNITY): Payer: Medicare PPO | Attending: Cardiology

## 2022-08-04 DIAGNOSIS — Q250 Patent ductus arteriosus: Secondary | ICD-10-CM

## 2022-08-04 DIAGNOSIS — I5030 Unspecified diastolic (congestive) heart failure: Secondary | ICD-10-CM | POA: Diagnosis not present

## 2022-08-04 LAB — ECHOCARDIOGRAM COMPLETE
Area-P 1/2: 5.93 cm2
P 1/2 time: 305 msec
S' Lateral: 2.3 cm

## 2022-08-10 ENCOUNTER — Telehealth: Payer: Self-pay | Admitting: Family Medicine

## 2022-08-10 MED ORDER — SUMATRIPTAN SUCCINATE 25 MG PO TABS: 25.0000 mg | ORAL_TABLET | ORAL | 0 refills | Status: DC | PRN

## 2022-08-10 NOTE — Telephone Encounter (Signed)
According to her chart, her GYN was the last person to send in the imitrex. Also, this patient needs to be called and scheduled as per the note.

## 2022-08-10 NOTE — Telephone Encounter (Signed)
I am happy to send in the lower dose of the migraine medication.  However, I'm confused/concerned by the triage note that says she is taking the medication daily.  If that's the case, she needs to schedule an appt.

## 2022-08-10 NOTE — Telephone Encounter (Signed)
Prescription Refill or Medication Request (non symptomatic) Reason for Call Medication Question / Request Initial Comment Caller states she needs a refill on her migraine medication. Translation No Nurse Assessment Nurse: Tressia Danas, RN, [place] Date/Time (Eastern Time): 08/07/2022 5:54:49 PM Confirm and document reason for call. If symptomatic, describe symptoms. ---Caller states she needs a refill on her migraine medication. caller states she takes it 1-2 times a month. states she has 1-2 pills left. states she takes half a pill. states she has had to take them daily. last time it was filled was in May. caller states she took allergy medication and a pill today. Does the patient have any new or worsening symptoms? ---No Nurse: Tressia Danas, RN, [place] Date/Time (Eastern Time): 08/07/2022 5:59:55 PM Please select the assessment type ---Refill Additional Documentation ---Sumatriptan 50mg . she takes half a tablet would like it changed to 25 mg if possible. the pills are triangle shaped and they are hard to cut in half. Does the patient have enough medication to last until the office opens? ---Yes Additional Documentation ---[facility] [address] Question Affirmed Notes Nurse Date/Time Eilene Ghazi Time) Headache Headache is a chronic symptom (recurrent [place], RN, [facility] 08/07/2022 5:57:10 PM PLEASE NOTE: All timestamps contained within this report are represented as [place] Standard Time. CONFIDENTIALTY NOTICE: This fax transmission is intended only for the addressee. It contains information that is legally privileged, confidential or otherwise protected from use or disclosure. If you are not the intended recipient, you are strictly prohibited from reviewing, disclosing, copying using or disseminating any of this information or taking any action in reliance on or regarding this information. If you have received this fax in error, please notify us  immediately by telephone so that we can arrange for its return to [place]. Phone: [phone], Toll-Free: [phone], Fax: [phone] Page: 2 of 2 Call Id: [id number] Guidelines Guideline Title Affirmed Question Affirmed Notes Nurse Date/Time Eilene Ghazi Time) or ongoing AND present > 4 weeks) Disp. Time Eilene Ghazi Time) Disposition Final User 08/07/2022 5:50:44 PM Send To Nurse Lonia Farber, RN, Greg 08/07/2022 5:59:36 PM See PCP within [place], [place] Final Disposition 08/07/2022 5:59:36 PM See PCP within 2 [place], RN, Catering manager Understands Yes PreDisposition Did not know what to do Care Advice Given Per Guideline SEE PCP WITHIN 2 WEEKS: * You need to be seen for this ongoing problem within the next 2 weeks. CARE ADVICE given pe

## 2022-08-20 ENCOUNTER — Ambulatory Visit (INDEPENDENT_AMBULATORY_CARE_PROVIDER_SITE_OTHER): Payer: Medicare PPO

## 2022-08-20 VITALS — Ht 62.0 in | Wt 131.0 lb

## 2022-08-20 DIAGNOSIS — Z0000 Encounter for general adult medical examination without abnormal findings: Secondary | ICD-10-CM | POA: Diagnosis not present

## 2022-08-20 NOTE — Progress Notes (Signed)
Subjective:   Sandra Mora is a 66 y.o. female who presents for an Initial Medicare Annual Wellness Visit.   Virtual Visit via Telephone Note  I connected with  Sandra Mora on 08/20/22 at  8:15 AM EDT by telephone and verified that I am speaking with the correct person using two identifiers.  Location: Patient: home  Provider: [place]  Persons participating in the virtual visit: patient/Nurse Health Advisor   I discussed the limitations, risks, security and privacy concerns of performing an evaluation and management service by telephone and the availability of in person appointments. The patient expressed understanding and agreed to proceed.  Interactive audio and video telecommunications were attempted between this nurse and patient, however failed, due to patient having technical difficulties OR patient did not have access to video capability.  We continued and completed visit with audio only.  Some vital signs may be absent or patient reported.   Lorrene Reid, LPN  Review of Systems     Cardiac Risk Factors include: advanced age (>41men, >55 women)     Objective:    Today's Vitals   08/20/22 0815  Weight: 131 lb (59.4 kg)  Height: 5\' 2"  (1.575 m)   Body mass index is 23.96 kg/m.     08/20/2022    8:19 AM 10/28/2015    8:32 PM  Advanced Directives  Does Patient Have a Medical Advance Directive? Yes No  Type of 14/03/2015 of [place];Living will   Copy of Healthcare Power of Attorney in Chart? No - copy requested     Current Medications (verified) Outpatient Encounter Medications as of 08/20/2022  Medication Sig   buPROPion (WELLBUTRIN) 75 MG tablet Take 1 tablet (75 mg total) by mouth 2 (two) times daily.   Calcium Carbonate-Vitamin D (CALCIUM 600 + D PO) Take by mouth daily.   Cholecalciferol (VITAMIN D) 2000 UNITS CAPS Take 4,000 Units by mouth daily.   magnesium oxide (MAG-OX) 400 MG tablet Take 400 mg by mouth daily.    Multiple Vitamin (MULTIVITAMIN) tablet Take 1 tablet by mouth daily.   Omega-3 Fatty Acids (FISH OIL PO) Take 2 Capfuls by mouth daily.   Probiotic Product (PROBIOTIC DAILY PO) Take by mouth.   pyridOXINE (VITAMIN B-6) 100 MG tablet Vitamin B6   SUMAtriptan (IMITREX) 25 MG tablet Take 1 tablet (25 mg total) by mouth every 2 (two) hours as needed for migraine. May repeat in 2 hours if headache persists or recurs.   fluticasone (FLONASE) 50 MCG/ACT nasal spray Place into the nose.   No facility-administered encounter medications on file as of 08/20/2022.    Allergies (verified) Codeine   History: Past Medical History:  Diagnosis Date   Allergy    seasonal   Migraine    Osteoporosis    Spine -3 (2013)   Past Surgical History:  Procedure Laterality Date   ABDOMINAL HYSTERECTOMY     COSMETIC SURGERY     Family History  Problem Relation Age of Onset   Heart disease Mother    Heart attack Father    Heart disease Father    Arrhythmia Sister    Stroke Sister    Social History   Socioeconomic History   Marital status: Married    Spouse name: Not on file   Number of children: 2   Years of education: Not on file   Highest education level: Not on file  Occupational History   Occupation: occupational therapist    Employer: [employer]  Tobacco Use   Smoking status: Never   Smokeless tobacco: Never  Substance and Sexual Activity   Alcohol use: No   Drug use: No   Sexual activity: Not on file  Other Topics Concern   Not on file  Social History Narrative   Not on file   Social Determinants of Health   Financial Resource Strain: Low Risk  (08/20/2022)   Overall Financial Resource Strain (CARDIA)    Difficulty of Paying Living Expenses: Not hard at all  Food Insecurity: No Food Insecurity (08/20/2022)   Hunger Vital Sign    Worried About Running Out of Food in the Last Year: Never true    Ran Out of Food in the Last Year: Never true  Transportation Needs: No  Transportation Needs (08/20/2022)   PRAPARE - Administrator, Civil ServiceTransportation    Lack of Transportation (Medical): No    Lack of Transportation (Non-Medical): No  Physical Activity: Sufficiently Active (08/20/2022)   Exercise Vital Sign    Days of Exercise per Week: 4 days    Minutes of Exercise per Session: 40 min  Stress: No Stress Concern Present (08/20/2022)   [employer] - Occupational Stress Questionnaire    Feeling of Stress : Not at all  Social Connections: Socially Integrated (08/20/2022)   Social Connection and Isolation Panel [NHANES]    Frequency of Communication with Friends and Family: More than three times a week    Frequency of Social Gatherings with Friends and Family: More than three times a week    Attends Religious Services: More than 4 times per year    Active Member of [employer] Financial[employer]ubs or Organizations: Yes    Attends Engineer, structuralClub or Organization Meetings: More than 4 times per year    Marital Status: Married    Tobacco Counseling Counseling given: Not Answered   Clinical Intake:  Pre-visit preparation completed: Yes  Pain : No/denies pain     Nutritional Risks: None Diabetes: No  How often do you need to have someone help you when you read instructions, pamphlets, or other written materials from your doctor or pharmacy?: 1 - Never  Diabetic?no   Interpreter Needed?: No  Information entered by :: Renie OraLaura Wilson, LPN   Activities of Daily Living    08/20/2022    8:20 AM  In your present state of health, do you have any difficulty performing the following activities:  Hearing? 0  Vision? 0  Difficulty concentrating or making decisions? 0  Walking or climbing stairs? 0  Dressing or bathing? 0  Doing errands, shopping? 0  Preparing Food and eating ? N  Using the Toilet? N  In the past six months, have you accidently leaked urine? N  Do you have problems with loss of bowel control? N  Managing your Medications? N  Managing your Finances? N   Housekeeping or managing your Housekeeping? N    Patient Care Team: Sheliah Hatchabori, Katherine E, MD as PCP - General (Family Medicine) [place], Amy, MD as Consulting Physician (Dermatology) Zelphia CairoAdkins, Gretchen, MD as Consulting Physician (Obstetrics and Gynecology) Marzella SchleinWood, Craig H., MD as Consulting Physician (Ophthalmology) Charna ElizabethMann, Jyothi, MD as Consulting Physician (Gastroenterology)  Indicate any recent Medical Services you may have received from other than [facility] providers in the past year (date may be approximate).     Assessment:   This is a routine wellness examination for [place].  Hearing/Vision screen Vision Screening - Comments:: Annual eye wear contacts   Dietary issues and exercise activities discussed: Current Exercise  Habits: Home exercise routine, Type of exercise: walking, Time (Minutes): 30, Frequency (Times/Week): 4, Weekly Exercise (Minutes/Week): 120, Intensity: Mild, Exercise limited by: None identified   Goals Addressed             This Visit's Progress    DIET - INCREASE WATER INTAKE         Depression Screen    08/20/2022    8:18 AM 08/03/2022   10:33 AM 06/25/2022    8:18 AM 08/21/2021    1:37 PM 09/16/2020    7:39 AM 10/09/2019    4:03 PM 01/13/2019    4:08 PM  PHQ 2/9 Scores  PHQ - 2 Score 0 0 0 0 0 0 0  PHQ- 9 Score 0 0 6 0 0  0    Fall Risk    08/20/2022    8:16 AM 08/03/2022   10:32 AM 06/25/2022    8:18 AM 08/21/2021    1:36 PM 09/16/2020    7:39 AM  Fall Risk   Falls in the past year? 0 0 0 0 0  Number falls in past yr: 0 0 0 0 0  Injury with Fall? 0 0 0 0 0  Risk for fall due to : No Fall Risks No Fall Risks No Fall Risks No Fall Risks No Fall Risks  Follow up Falls prevention discussed Falls evaluation completed Falls evaluation completed Falls evaluation completed     FALL RISK PREVENTION PERTAINING TO THE HOME:  Any stairs in or around the home? No  If so, are there any without handrails? No  Home free of loose throw rugs in walkways, pet  beds, electrical cords, etc? Yes  Adequate lighting in your home to reduce risk of falls? Yes   ASSISTIVE DEVICES UTILIZED TO PREVENT FALLS:  Life alert? No  Use of a cane, walker or w/c? No  Grab bars in the bathroom? No  Shower chair or bench in shower? Yes  Elevated toilet seat or a handicapped toilet? No          08/20/2022    8:20 AM  6CIT Screen  What Year? 0 points  What month? 0 points  What time? 0 points  Count back from 20 0 points  Months in reverse 0 points  Repeat phrase 2 points  Total Score 2 points    Immunizations Immunization History  Administered Date(s) Administered   Influenza,inj,Quad PF,6+ Mos 09/22/2016, 10/04/2017, 09/12/2018, 09/16/2020   Influenza-Unspecified 09/23/2021   PFIZER(Purple Top)SARS-COV-2 Vaccination 01/26/2020, 02/16/2020   Tdap 10/26/2010, 09/16/2020    TDAP status: Up to date  Flu Vaccine status: Due, Education has been provided regarding the importance of this vaccine. Advised may receive this vaccine at local pharmacy or Health Dept. Aware to provide a copy of the vaccination record if obtained from local pharmacy or Health Dept. Verbalized acceptance and understanding.  Pneumococcal vaccine status: Due, Education has been provided regarding the importance of this vaccine. Advised may receive this vaccine at local pharmacy or Health Dept. Aware to provide a copy of the vaccination record if obtained from local pharmacy or Health Dept. Verbalized acceptance and understanding.  Covid-19 vaccine status: Completed vaccines  Qualifies for Shingles Vaccine? Yes   Zostavax completed No   Shingrix Completed?: No.    Education has been provided regarding the importance of this vaccine. Patient has been advised to call insurance company to determine out of pocket expense if they have not yet received this vaccine. Advised may also receive vaccine at  local pharmacy or Health Dept. Verbalized acceptance and understanding.  Screening  Tests Health Maintenance  Topic Date Due   COVID-19 Vaccine (3 - Pfizer series) 04/12/2020   Zoster Vaccines- Shingrix (1 of 2) 09/25/2022 (Originally 08/18/2006)   INFLUENZA VACCINE  02/21/2023 (Originally 06/23/2022)   Pneumonia Vaccine 52+ Years old (1 - PCV) 06/26/2023 (Originally 08/18/2021)   MAMMOGRAM  08/04/2023 (Originally 07/10/2022)   COLONOSCOPY (Pts 45-79yrs Insurance coverage will need to be confirmed)  06/27/2029   TETANUS/TDAP  09/16/2030   DEXA SCAN  Completed   Hepatitis C Screening  Completed   HPV VACCINES  Aged Out    Health Maintenance  Health Maintenance Due  Topic Date Due   COVID-19 Vaccine (3 - Pfizer series) 04/12/2020    Colorectal cancer screening: Type of screening: Colonoscopy. Completed 06/28/2019. Repeat every 10 years  Mammogram status: Completed scheduled 08/2022. Repeat every year  Bone Density status: Completed 07/21/2018. Results reflect: Bone density results: OSTEOPENIA. Repeat every 5 years.  Lung Cancer Screening: (Low Dose CT Chest recommended if Age 42-80 years, 30 pack-year currently smoking OR have quit w/in 15years.) does not qualify.   Lung Cancer Screening Referral: n/a  Additional Screening:  Hepatitis C Screening: does not qualify; Completed 10/025/2021  Vision Screening: Recommended annual ophthalmology exams for early detection of glaucoma and other disorders of the eye. Is the patient up to date with their annual eye exam?  No  Who is the provider or what is the name of the office in which the patient attends annual eye exams? Dr.Dunn  If pt is not established with a provider, would they like to be referred to a provider to establish care? No .   Dental Screening: Recommended annual dental exams for proper oral hygiene  Community Resource Referral / Chronic Care Management: CRR required this visit?  No   CCM required this visit?  No      Plan:     I have personally reviewed and noted the following in the patient's  chart:   Medical and social history Use of alcohol, tobacco or illicit drugs  Current medications and supplements including opioid prescriptions. Patient is not currently taking opioid prescriptions. Functional ability and status Nutritional status Physical activity Advanced directives List of other physicians Hospitalizations, surgeries, and ER visits in previous 12 months Vitals Screenings to include cognitive, depression, and falls Referrals and appointments  In addition, I have reviewed and discussed with patient certain preventive protocols, quality metrics, and best practice recommendations. A written personalized care plan for preventive services as well as general preventive health recommendations were provided to patient.     Daphane Shepherd, LPN   04/01/2584   Nurse Notes:               Subjective:   Sandra Mora is a 66 y.o. female who presents for Medicare Annual (Subsequent) preventive examination.  Review of Systems     Cardiac Risk Factors include: advanced age (>58men, >61 women)     Objective:    Today's Vitals   08/20/22 0815  Weight: 131 lb (59.4 kg)  Height: 5\' 2"  (1.575 m)   Body mass index is 23.96 kg/m.     08/20/2022    8:19 AM 10/28/2015    8:32 PM  Advanced Directives  Does Patient Have a Medical Advance Directive? Yes No  Type of Paramedic of [place];Living will   Copy of [place] in Chart? No - copy requested  Current Medications (verified) Outpatient Encounter Medications as of 08/20/2022  Medication Sig   buPROPion (WELLBUTRIN) 75 MG tablet Take 1 tablet (75 mg total) by mouth 2 (two) times daily.   Calcium Carbonate-Vitamin D (CALCIUM 600 + D PO) Take by mouth daily.   Cholecalciferol (VITAMIN D) 2000 UNITS CAPS Take 4,000 Units by mouth daily.   magnesium oxide (MAG-OX) 400 MG tablet Take 400 mg by mouth daily.   Multiple Vitamin (MULTIVITAMIN) tablet Take 1 tablet by  mouth daily.   Omega-3 Fatty Acids (FISH OIL PO) Take 2 Capfuls by mouth daily.   Probiotic Product (PROBIOTIC DAILY PO) Take by mouth.   pyridOXINE (VITAMIN B-6) 100 MG tablet Vitamin B6   SUMAtriptan (IMITREX) 25 MG tablet Take 1 tablet (25 mg total) by mouth every 2 (two) hours as needed for migraine. May repeat in 2 hours if headache persists or recurs.   fluticasone (FLONASE) 50 MCG/ACT nasal spray Place into the nose.   No facility-administered encounter medications on file as of 08/20/2022.    Allergies (verified) Codeine   History: Past Medical History:  Diagnosis Date   Allergy    seasonal   Migraine    Osteoporosis    Spine -3 (2013)   Past Surgical History:  Procedure Laterality Date   ABDOMINAL HYSTERECTOMY     COSMETIC SURGERY     Family History  Problem Relation Age of Onset   Heart disease Mother    Heart attack Father    Heart disease Father    Arrhythmia Sister    Stroke Sister    Social History   Socioeconomic History   Marital status: Married    Spouse name: Not on file   Number of children: 2   Years of education: Not on file   Highest education level: Not on file  Occupational History   Occupation: occupational therapist    Employer: [employer]  Tobacco Use   Smoking status: Never   Smokeless tobacco: Never  Substance and Sexual Activity   Alcohol use: No   Drug use: No   Sexual activity: Not on file  Other Topics Concern   Not on file  Social History Narrative   Not on file   Social Determinants of Health   Financial Resource Strain: Low Risk  (08/20/2022)   Overall Financial Resource Strain (CARDIA)    Difficulty of Paying Living Expenses: Not hard at all  Food Insecurity: No Food Insecurity (08/20/2022)   Hunger Vital Sign    Worried About Running Out of Food in the Last Year: Never true    Ran Out of Food in the Last Year: Never true  Transportation Needs: No Transportation Needs (08/20/2022)   PRAPARE - Therapist, art (Medical): No    Lack of Transportation (Non-Medical): No  Physical Activity: Sufficiently Active (08/20/2022)   Exercise Vital Sign    Days of Exercise per Week: 4 days    Minutes of Exercise per Session: 40 min  Stress: No Stress Concern Present (08/20/2022)   [employer] of Occupational Health - Occupational Stress Questionnaire    Feeling of Stress : Not at all  Social Connections: Socially Integrated (08/20/2022)   Social Connection and Isolation Panel [NHANES]    Frequency of Communication with Friends and Family: More than three times a week    Frequency of Social Gatherings with Friends and Family: More than three times a week    Attends Religious Services: More than 4 times  per year    Active Member of Clubs or Organizations: Yes    Attends Banker Meetings: More than 4 times per year    Marital Status: Married    Tobacco Counseling Counseling given: Not Answered   Clinical Intake:  Pre-visit preparation completed: Yes  Pain : No/denies pain     Nutritional Risks: None Diabetes: No  How often do you need to have someone help you when you read instructions, pamphlets, or other written materials from your doctor or pharmacy?: 1 - Never  Diabetic?no  Interpreter Needed?: No  Information entered by :: Renie Ora, LPN   Activities of Daily Living    08/20/2022    8:20 AM  In your present state of health, do you have any difficulty performing the following activities:  Hearing? 0  Vision? 0  Difficulty concentrating or making decisions? 0  Walking or climbing stairs? 0  Dressing or bathing? 0  Doing errands, shopping? 0  Preparing Food and eating ? N  Using the Toilet? N  In the past six months, have you accidently leaked urine? N  Do you have problems with loss of bowel control? N  Managing your Medications? N  Managing your Finances? N  Housekeeping or managing your Housekeeping? N    Patient Care  Team: Sheliah Hatch, MD as PCP - General (Family Medicine) [place], Amy, MD as Consulting Physician (Dermatology) Zelphia Cairo, MD as Consulting Physician (Obstetrics and Gynecology) Marzella Schlein., MD as Consulting Physician (Ophthalmology) Charna Elizabeth, MD as Consulting Physician (Gastroenterology)  Indicate any recent Medical Services you may have received from other than [facility] providers in the past year (date may be approximate).     Assessment:   This is a routine wellness examination for [place].  Hearing/Vision screen Vision Screening - Comments:: Annual eye wear contacts   Dietary issues and exercise activities discussed: Current Exercise Habits: Home exercise routine, Type of exercise: walking, Time (Minutes): 30, Frequency (Times/Week): 4, Weekly Exercise (Minutes/Week): 120, Intensity: Mild, Exercise limited by: None identified   Goals Addressed             This Visit's Progress    DIET - INCREASE WATER INTAKE        Depression Screen    08/20/2022    8:18 AM 08/03/2022   10:33 AM 06/25/2022    8:18 AM 08/21/2021    1:37 PM 09/16/2020    7:39 AM 10/09/2019    4:03 PM 01/13/2019    4:08 PM  PHQ 2/9 Scores  PHQ - 2 Score 0 0 0 0 0 0 0  PHQ- 9 Score 0 0 6 0 0  0    Fall Risk    08/20/2022    8:16 AM 08/03/2022   10:32 AM 06/25/2022    8:18 AM 08/21/2021    1:36 PM 09/16/2020    7:39 AM  Fall Risk   Falls in the past year? 0 0 0 0 0  Number falls in past yr: 0 0 0 0 0  Injury with Fall? 0 0 0 0 0  Risk for fall due to : No Fall Risks No Fall Risks No Fall Risks No Fall Risks No Fall Risks  Follow up Falls prevention discussed Falls evaluation completed Falls evaluation completed Falls evaluation completed     FALL RISK PREVENTION PERTAINING TO THE HOME:  Any stairs in or around the home? Yes  If so, are there any without handrails? No  Home free of loose  throw rugs in walkways, pet beds, electrical cords, etc? Yes  Adequate lighting in your home to  reduce risk of falls? Yes   ASSISTIVE DEVICES UTILIZED TO PREVENT FALLS:  Life alert? Yes  Use of a cane, walker or w/c? No  Grab bars in the bathroom? No  Shower chair or bench in shower? Yes  Elevated toilet seat or a handicapped toilet? No   :        08/20/2022    8:20 AM  6CIT Screen  What Year? 0 points  What month? 0 points  What time? 0 points  Count back from 20 0 points  Months in reverse 0 points  Repeat phrase 2 points  Total Score 2 points    Immunizations Immunization History  Administered Date(s) Administered   Influenza,inj,Quad PF,6+ Mos 09/22/2016, 10/04/2017, 09/12/2018, 09/16/2020   Influenza-Unspecified 09/23/2021   PFIZER(Purple Top)SARS-COV-2 Vaccination 01/26/2020, 02/16/2020   Tdap 10/26/2010, 09/16/2020    TDAP status: Up to date  Flu Vaccine status: Due, Education has been provided regarding the importance of this vaccine. Advised may receive this vaccine at local pharmacy or Health Dept. Aware to provide a copy of the vaccination record if obtained from local pharmacy or Health Dept. Verbalized acceptance and understanding.  Pneumococcal vaccine status: Due, Education has been provided regarding the importance of this vaccine. Advised may receive this vaccine at local pharmacy or Health Dept. Aware to provide a copy of the vaccination record if obtained from local pharmacy or Health Dept. Verbalized acceptance and understanding.  Covid-19 vaccine status: Completed vaccines  Qualifies for Shingles Vaccine? Yes   Zostavax completed No   Shingrix Completed?: No.    Education has been provided regarding the importance of this vaccine. Patient has been advised to call insurance company to determine out of pocket expense if they have not yet received this vaccine. Advised may also receive vaccine at local pharmacy or Health Dept. Verbalized acceptance and understanding.  Screening Tests Health Maintenance  Topic Date Due   COVID-19 Vaccine (3 -  Pfizer series) 04/12/2020   Zoster Vaccines- Shingrix (1 of 2) 09/25/2022 (Originally 08/18/2006)   INFLUENZA VACCINE  02/21/2023 (Originally 06/23/2022)   Pneumonia Vaccine 27+ Years old (1 - PCV) 06/26/2023 (Originally 08/18/2021)   MAMMOGRAM  08/04/2023 (Originally 07/10/2022)   COLONOSCOPY (Pts 45-62yrs Insurance coverage will need to be confirmed)  06/27/2029   TETANUS/TDAP  09/16/2030   DEXA SCAN  Completed   Hepatitis C Screening  Completed   HPV VACCINES  Aged Out    Health Maintenance  Health Maintenance Due  Topic Date Due   COVID-19 Vaccine (3 - Pfizer series) 04/12/2020    Colorectal cancer screening: Type of screening: Colonoscopy. Completed 06/28/2019. Repeat every 10 years  Mammogram status: Completed scheduled 09/2022 With Dr.Atkins . Repeat every year  Bone Density status: Completed 07/21/2018. Results reflect: Bone density results: OSTEOPENIA. Repeat every 5 years.  Lung Cancer Screening: (Low Dose CT Chest recommended if Age 31-80 years, 30 pack-year currently smoking OR have quit w/in 15years.) does not qualify.   Lung Cancer Screening Referral: n/a  Additional Screening:  Hepatitis C Screening: does not qualify;   Vision Screening: Recommended annual ophthalmology exams for early detection of glaucoma and other disorders of the eye. Is the patient up to date with their annual eye exam?  Yes  Who is the provider or what is the name of the office in which the patient attends annual eye exams? Dr.Dunn  If pt is not established with  a provider, would they like to be referred to a provider to establish care? No .   Dental Screening: Recommended annual dental exams for proper oral hygiene  Community Resource Referral / Chronic Care Management: CRR required this visit?  No   CCM required this visit?  No      Plan:     I have personally reviewed and noted the following in the patient's chart:   Medical and social history Use of alcohol, tobacco or illicit  drugs  Current medications and supplements including opioid prescriptions. Patient is not currently taking opioid prescriptions. Functional ability and status Nutritional status Physical activity Advanced directives List of other physicians Hospitalizations, surgeries, and ER visits in previous 12 months Vitals Screenings to include cognitive, depression, and falls Referrals and appointments  In addition, I have reviewed and discussed with patient certain preventive protocols, quality metrics, and best practice recommendations. A written personalized care plan for preventive services as well as general preventive health recommendations were provided to patient.     Lorrene Reid, LPN   03/01/8118   Nurse Notes: Due flu and Pneumonia vaccine

## 2022-08-20 NOTE — Patient Instructions (Signed)
Sandra Mora , Thank you for taking time to come for your Medicare Wellness Visit. I appreciate your ongoing commitment to your health goals. Please review the following plan we discussed and let me know if I can assist you in the future.   These are the goals we discussed:  Goals      DIET - INCREASE WATER INTAKE        This is a list of the screening recommended for you and due dates:  Health Maintenance  Topic Date Due   COVID-19 Vaccine (3 - Pfizer series) 04/12/2020   Zoster (Shingles) Vaccine (1 of 2) 09/25/2022*   Flu Shot  02/21/2023*   Pneumonia Vaccine (1 - PCV) 06/26/2023*   Mammogram  08/04/2023*   Colon Cancer Screening  06/27/2029   Tetanus Vaccine  09/16/2030   DEXA scan (bone density measurement)  Completed   Hepatitis C Screening: USPSTF Recommendation to screen - Ages 22-79 yo.  Completed   HPV Vaccine  Aged Out  *Topic was postponed. The date shown is not the original due date.    Advanced directives: Please bring a copy of your health care power of attorney and living will to the office to be added to your chart at your convenience.   Conditions/risks identified: Aim for 30 minutes of exercise or brisk walking, 6-8 glasses of water, and 5 servings of fruits and vegetables each day.   Next appointment: Follow up in one year for your annual wellness visit    Preventive Care 65 Years and Older, Female Preventive care refers to lifestyle choices and visits with your health care provider that can promote health and wellness. What does preventive care include? A yearly physical exam. This is also called an annual well check. Dental exams once or twice a year. Routine eye exams. Ask your health care provider how often you should have your eyes checked. Personal lifestyle choices, including: Daily care of your teeth and gums. Regular physical activity. Eating a healthy diet. Avoiding tobacco and drug use. Limiting alcohol use. Practicing safe sex. Taking low-dose  aspirin every day. Taking vitamin and mineral supplements as recommended by your health care provider. What happens during an annual well check? The services and screenings done by your health care provider during your annual well check will depend on your age, overall health, lifestyle risk factors, and family history of disease. Counseling  Your health care provider may ask you questions about your: Alcohol use. Tobacco use. Drug use. Emotional well-being. Home and relationship well-being. Sexual activity. Eating habits. History of falls. Memory and ability to understand (cognition). Work and work Statistician. Reproductive health. Screening  You may have the following tests or measurements: Height, weight, and BMI. Blood pressure. Lipid and cholesterol levels. These may be checked every 5 years, or more frequently if you are over 28 years old. Skin check. Lung cancer screening. You may have this screening every year starting at age 81 if you have a 30-pack-year history of smoking and currently smoke or have quit within the past 15 years. Fecal occult blood test (FOBT) of the stool. You may have this test every year starting at age 21. Flexible sigmoidoscopy or colonoscopy. You may have a sigmoidoscopy every 5 years or a colonoscopy every 10 years starting at age 58. Hepatitis C blood test. Hepatitis B blood test. Sexually transmitted disease (STD) testing. Diabetes screening. This is done by checking your blood sugar (glucose) after you have not eaten for a while (fasting). You may have  this done every 1-3 years. Bone density scan. This is done to screen for osteoporosis. You may have this done starting at age 14. Mammogram. This may be done every 1-2 years. Talk to your health care provider about how often you should have regular mammograms. Talk with your health care provider about your test results, treatment options, and if necessary, the need for more tests. Vaccines  Your  health care provider may recommend certain vaccines, such as: Influenza vaccine. This is recommended every year. Tetanus, diphtheria, and acellular pertussis (Tdap, Td) vaccine. You may need a Td booster every 10 years. Zoster vaccine. You may need this after age 69. Pneumococcal 13-valent conjugate (PCV13) vaccine. One dose is recommended after age 79. Pneumococcal polysaccharide (PPSV23) vaccine. One dose is recommended after age 26. Talk to your health care provider about which screenings and vaccines you need and how often you need them. This information is not intended to replace advice given to you by your health care provider. Make sure you discuss any questions you have with your health care provider. Document Released: 12/06/2015 Document Revised: 07/29/2016 Document Reviewed: 09/10/2015 Elsevier Interactive Patient Education  2017 [employer] Prevention in the Home Falls can cause injuries. They can happen to people of all ages. There are many things you can do to make your home safe and to help prevent falls. What can I do on the outside of my home? Regularly fix the edges of walkways and driveways and fix any cracks. Remove anything that might make you trip as you walk through a door, such as a raised step or threshold. Trim any bushes or trees on the path to your home. Use bright outdoor lighting. Clear any walking paths of anything that might make someone trip, such as rocks or tools. Regularly check to see if handrails are loose or broken. Make sure that both sides of any steps have handrails. Any raised decks and porches should have guardrails on the edges. Have any leaves, snow, or ice cleared regularly. Use sand or salt on walking paths during winter. Clean up any spills in your garage right away. This includes oil or grease spills. What can I do in the bathroom? Use night lights. Install grab bars by the toilet and in the tub and shower. Do not use towel bars as  grab bars. Use non-skid mats or decals in the tub or shower. If you need to sit down in the shower, use a plastic, non-slip stool. Keep the floor dry. Clean up any water that spills on the floor as soon as it happens. Remove soap buildup in the tub or shower regularly. Attach bath mats securely with double-sided non-slip rug tape. Do not have throw rugs and other things on the floor that can make you trip. What can I do in the bedroom? Use night lights. Make sure that you have a light by your bed that is easy to reach. Do not use any sheets or blankets that are too big for your bed. They should not hang down onto the floor. Have a firm chair that has side arms. You can use this for support while you get dressed. Do not have throw rugs and other things on the floor that can make you trip. What can I do in the kitchen? Clean up any spills right away. Avoid walking on wet floors. Keep items that you use a lot in easy-to-reach places. If you need to reach something above you, use a strong step stool  that has a grab bar. Keep electrical cords out of the way. Do not use floor polish or wax that makes floors slippery. If you must use wax, use non-skid floor wax. Do not have throw rugs and other things on the floor that can make you trip. What can I do with my stairs? Do not leave any items on the stairs. Make sure that there are handrails on both sides of the stairs and use them. Fix handrails that are broken or loose. Make sure that handrails are as long as the stairways. Check any carpeting to make sure that it is firmly attached to the stairs. Fix any carpet that is loose or worn. Avoid having throw rugs at the top or bottom of the stairs. If you do have throw rugs, attach them to the floor with carpet tape. Make sure that you have a light switch at the top of the stairs and the bottom of the stairs. If you do not have them, ask someone to add them for you. What else can I do to help prevent  falls? Wear shoes that: Do not have high heels. Have rubber bottoms. Are comfortable and fit you well. Are closed at the toe. Do not wear sandals. If you use a stepladder: Make sure that it is fully opened. Do not climb a closed stepladder. Make sure that both sides of the stepladder are locked into place. Ask someone to hold it for you, if possible. Clearly mark and make sure that you can see: Any grab bars or handrails. First and last steps. Where the edge of each step is. Use tools that help you move around (mobility aids) if they are needed. These include: Canes. Walkers. Scooters. Crutches. Turn on the lights when you go into a dark area. Replace any light bulbs as soon as they burn out. Set up your furniture so you have a clear path. Avoid moving your furniture around. If any of your floors are uneven, fix them. If there are any pets around you, be aware of where they are. Review your medicines with your doctor. Some medicines can make you feel dizzy. This can increase your chance of falling. Ask your doctor what other things that you can do to help prevent falls. This information is not intended to replace advice given to you by your health care provider. Make sure you discuss any questions you have with your health care provider. Document Released: 09/05/2009 Document Revised: 04/16/2016 Document Reviewed: 12/14/2014 Elsevier Interactive Patient Education  2017 [employer].

## 2022-08-26 DIAGNOSIS — H40013 Open angle with borderline findings, low risk, bilateral: Secondary | ICD-10-CM | POA: Diagnosis not present

## 2022-09-08 ENCOUNTER — Other Ambulatory Visit: Payer: Self-pay | Admitting: Family Medicine

## 2022-09-29 ENCOUNTER — Ambulatory Visit (INDEPENDENT_AMBULATORY_CARE_PROVIDER_SITE_OTHER): Payer: Medicare PPO | Admitting: Family Medicine

## 2022-09-29 DIAGNOSIS — Z23 Encounter for immunization: Secondary | ICD-10-CM | POA: Diagnosis not present

## 2022-09-29 NOTE — Progress Notes (Signed)
Pt presented for her flu shot, given without issue

## 2022-10-07 ENCOUNTER — Other Ambulatory Visit: Payer: Medicare PPO

## 2022-10-10 ENCOUNTER — Other Ambulatory Visit: Payer: Self-pay | Admitting: Family Medicine

## 2022-10-12 ENCOUNTER — Other Ambulatory Visit: Payer: Self-pay

## 2022-10-23 ENCOUNTER — Telehealth: Payer: Self-pay | Admitting: Cardiovascular Disease

## 2022-10-23 DIAGNOSIS — E782 Mixed hyperlipidemia: Secondary | ICD-10-CM

## 2022-10-23 NOTE — Telephone Encounter (Signed)
Patient stated that [facility] radiology cancelled her cardiac scoring twice. She is asking to have it done at [facility].

## 2022-10-23 NOTE — Telephone Encounter (Signed)
If order for CT scoring test can be reentered. Patient would like to have it done at the [facility] location.  [facility] has canceled on her twice.

## 2022-10-26 ENCOUNTER — Other Ambulatory Visit: Payer: Self-pay

## 2022-10-26 NOTE — Telephone Encounter (Signed)
A new order will need to be placed. Thank you! 

## 2022-10-26 NOTE — Telephone Encounter (Signed)
New order placed for cardia scoring.

## 2022-11-08 ENCOUNTER — Other Ambulatory Visit: Payer: Self-pay | Admitting: Family Medicine

## 2022-11-19 ENCOUNTER — Ambulatory Visit (INDEPENDENT_AMBULATORY_CARE_PROVIDER_SITE_OTHER): Payer: Medicare PPO

## 2022-11-19 DIAGNOSIS — M810 Age-related osteoporosis without current pathological fracture: Secondary | ICD-10-CM

## 2022-11-19 MED ORDER — DENOSUMAB 60 MG/ML ~~LOC~~ SOSY
60.0000 mg | PREFILLED_SYRINGE | Freq: Once | SUBCUTANEOUS | Status: AC
  Administered 2022-11-19: 60 mg via SUBCUTANEOUS

## 2022-11-24 NOTE — Progress Notes (Signed)
Pt came in for her Prolia injection tolerated well . Advised to return to clinic in June 2024 for next injection

## 2022-11-25 ENCOUNTER — Other Ambulatory Visit: Payer: Medicare PPO

## 2022-12-18 ENCOUNTER — Encounter: Payer: Self-pay | Admitting: Plastic Surgery

## 2022-12-18 ENCOUNTER — Ambulatory Visit
Admission: RE | Admit: 2022-12-18 | Discharge: 2022-12-18 | Disposition: A | Discharge status: Home / Self Care | Payer: No Typology Code available for payment source | Source: Ambulatory Visit | Attending: Cardiovascular Disease | Admitting: Cardiovascular Disease

## 2022-12-18 ENCOUNTER — Ambulatory Visit (INDEPENDENT_AMBULATORY_CARE_PROVIDER_SITE_OTHER): Payer: Self-pay | Admitting: Plastic Surgery

## 2022-12-18 VITALS — BP 130/80 | HR 93 | Ht 62.0 in | Wt 134.0 lb

## 2022-12-18 DIAGNOSIS — Z719 Counseling, unspecified: Secondary | ICD-10-CM

## 2022-12-18 DIAGNOSIS — Q250 Patent ductus arteriosus: Secondary | ICD-10-CM

## 2022-12-18 DIAGNOSIS — F419 Anxiety disorder, unspecified: Secondary | ICD-10-CM

## 2022-12-18 NOTE — Progress Notes (Signed)
Patient ID: Sandra Mora, female    DOB: [birth year], 67 y.o.   MRN: [id number]   Chief Complaint  Patient presents with   Consult         The patient is a lovely 67 year old female here for evaluation of her face.  She has had some challenges with cystic acne.  She has not done anything aesthetically to her face in a while.  She has had some peels and the past.  She is likely a Rodney Booze 3 because she does not burn and her mother is of [place] ancestry.  She is 5 feet 2 inches tall and weighs 134 pounds.  She would like a refreshed look but does not want people to say she looks different.  She has some loss of midface volume and rhytids of her forehead.  He orbital area and glabella.    Review of Systems  Constitutional: Negative.   HENT: Negative.    Eyes: Negative.   Respiratory: Negative.    Cardiovascular: Negative.   Gastrointestinal: Negative.   Endocrine: Negative.   Genitourinary: Negative.     Past Medical History:  Diagnosis Date   Allergy    seasonal   Migraine    Osteoporosis    Spine -3 (2013)    Past Surgical History:  Procedure Laterality Date   ABDOMINAL HYSTERECTOMY     COSMETIC SURGERY        Current Outpatient Medications:    buPROPion (WELLBUTRIN) 75 MG tablet, TAKE 1 TABLET BY MOUTH TWICE A DAY, Disp: 60 tablet, Rfl: 3   Calcium Carbonate-Vitamin D (CALCIUM 600 + D PO), Take by mouth daily., Disp: , Rfl:    Cholecalciferol (VITAMIN D) 2000 UNITS CAPS, Take 4,000 Units by mouth daily., Disp: , Rfl:    magnesium oxide (MAG-OX) 400 MG tablet, Take 400 mg by mouth daily., Disp: , Rfl:    Multiple Vitamin (MULTIVITAMIN) tablet, Take 1 tablet by mouth daily., Disp: , Rfl:    Omega-3 Fatty Acids (FISH OIL PO), Take 2 Capfuls by mouth daily., Disp: , Rfl:    Probiotic Product (PROBIOTIC DAILY PO), Take by mouth., Disp: , Rfl:    pyridOXINE (VITAMIN B-6) 100 MG tablet, Vitamin B6, Disp: , Rfl:    SUMAtriptan (IMITREX) 25 MG tablet, TAKE 1 TABLET (25  MG TOTAL) BY MOUTH EVERY 2 (TWO) HOURS AS NEEDED FOR MIGRAINE. MAY REPEAT IN 2 HOURS IF HEADACHE PERSISTS OR RECURS., Disp: 9 tablet, Rfl: 1   fluticasone (FLONASE) 50 MCG/ACT nasal spray, Place into the nose., Disp: , Rfl:    Objective:   Vitals:   12/18/22 0919  BP: 130/80  Pulse: 93  SpO2: 100%    Physical Exam Vitals reviewed.  Constitutional:      Appearance: Normal appearance.  Pulmonary:     Effort: Pulmonary effort is normal.  Skin:    General: Skin is warm.     Capillary Refill: Capillary refill takes less than 2 seconds.  Neurological:     Mental Status: She is alert and oriented to person, place, and time.  Psychiatric:        Mood and Affect: Mood normal.        Behavior: Behavior normal.        Thought Content: Thought content normal.        Judgment: Judgment normal.     Assessment & Plan:  Encounter for counseling  Anxiety  Patient is a really good candidate for Botox of the upper face  and periorbital area.  She would do well with filler to the midface using Restylane Contour. 3 treatments of BBL would do well for some redness and then the halo would do really well to help smooth out her skin.  I also recommended hypochlorous acid for her face at night for the acne.  A consult with [facility] for some skin care treatment.  We also talked about surgical options.  She would like to start with minimally invasive procedures and care first.  We will send her a quote for the above information.  [place], DO

## 2022-12-21 ENCOUNTER — Encounter (INDEPENDENT_AMBULATORY_CARE_PROVIDER_SITE_OTHER): Payer: Self-pay

## 2022-12-21 DIAGNOSIS — N958 Other specified menopausal and perimenopausal disorders: Secondary | ICD-10-CM | POA: Diagnosis not present

## 2022-12-21 DIAGNOSIS — Z6825 Body mass index (BMI) 25.0-25.9, adult: Secondary | ICD-10-CM | POA: Diagnosis not present

## 2022-12-21 DIAGNOSIS — Z124 Encounter for screening for malignant neoplasm of cervix: Secondary | ICD-10-CM | POA: Diagnosis not present

## 2022-12-21 DIAGNOSIS — Z780 Asymptomatic menopausal state: Secondary | ICD-10-CM | POA: Diagnosis not present

## 2022-12-21 DIAGNOSIS — Z1272 Encounter for screening for malignant neoplasm of vagina: Secondary | ICD-10-CM | POA: Diagnosis not present

## 2022-12-21 DIAGNOSIS — Z7983 Long term (current) use of bisphosphonates: Secondary | ICD-10-CM | POA: Diagnosis not present

## 2022-12-21 DIAGNOSIS — Z1231 Encounter for screening mammogram for malignant neoplasm of breast: Secondary | ICD-10-CM | POA: Diagnosis not present

## 2022-12-21 DIAGNOSIS — Z719 Counseling, unspecified: Secondary | ICD-10-CM

## 2022-12-21 DIAGNOSIS — Z1151 Encounter for screening for human papillomavirus (HPV): Secondary | ICD-10-CM | POA: Diagnosis not present

## 2022-12-21 DIAGNOSIS — M8588 Other specified disorders of bone density and structure, other site: Secondary | ICD-10-CM | POA: Diagnosis not present

## 2023-01-11 ENCOUNTER — Ambulatory Visit (INDEPENDENT_AMBULATORY_CARE_PROVIDER_SITE_OTHER): Payer: Self-pay | Admitting: Plastic Surgery

## 2023-01-11 DIAGNOSIS — Z719 Counseling, unspecified: Secondary | ICD-10-CM

## 2023-01-11 NOTE — Progress Notes (Signed)
Botulinum Toxin and Filler Injection Procedure Note  Procedure: Cosmetic botulinum toxin and Filler administration  Pre-operative Diagnosis: Dynamic rhytides and midface volume loss  Post-operative Diagnosis: Same  Complications:  None  Brief history: The patient desires botulinum toxin injection of her forehead. I discussed with the patient this proposed procedure of botulinum toxin injections, which is customized depending on the particular needs of the patient. It is performed on facial rhytids as a temporary correction. The alternatives were discussed with the patient. The risks were addressed including bleeding, scarring, infection, damage to deeper structures, asymmetry, and chronic pain, which may occur infrequently after a procedure. The individual's choice to undergo a surgical procedure is based on the comparison of risks to potential benefits. Other risks include unsatisfactory results, brow ptosis, eyelid ptosis, allergic reaction, temporary paralysis, which should go away with time, bruising, blurring disturbances and delayed healing. Botulinum toxin injections do not arrest the aging process or produce permanent tightening of the eyelid.  Operative intervention maybe necessary to maintain the results of a blepharoplasty or botulinum toxin. The patient understands and wishes to proceed.  Procedure: The area was prepped with alcohol and dried with a clean gauze. Using a clean technique, the botulinum toxin was diluted with 1.25 cc of preservative-free normal saline which was slowly injected with an 18 gauge needle in a tuberculin syringes.  A 32 gauge needles were then used to inject the botulinum toxin. This mixture allow for an aliquot of 4 units per 0.1 cc in each injection site.    Subsequently the mixture was injected in the glabellar and forehead area with preservation of the temporal branch to the lateral eyebrow as well as into each lateral canthal area beginning from the lateral  orbital rim medial to the zygomaticus major in 3 separate areas. A total of 28 Units of botulinum toxin was used. The forehead and glabellar area was injected with care to inject intramuscular only while holding pressure on the supratrochlear vessels in each area during each injection on either side of the medial corrugators. The injection proceeded vertically superiorly to the medial 2/3 of the frontalis muscle and superior 2/3 of the lateral frontalis, again with preservation of the frontal branch.  The midface area was injected at the 3 sub-regions of the mid-face: zygomaticomalar region, anteromedial cheek region, and submalar region for a total of one syringe on each side of the face. The technique used was serial puncture with equal injections in the 3 sub-regions: the zygomaticomalar region, the anteromedial cheek, and the submalar region. The upper lip was injected to include the cupid bow and 0.5 cc in the central lower lip.  No complications were noted. Light pressure was held for 5 minutes. She was instructed explicitly in post-operative care.  Botox LOT:  [id number]  Restylane Contour x 3 LOT: [id number]  Restylane Kysse 1 ml LOT: [id number]

## 2023-01-20 ENCOUNTER — Other Ambulatory Visit: Payer: Self-pay | Admitting: Family Medicine

## 2023-01-20 DIAGNOSIS — M67912 Unspecified disorder of synovium and tendon, left shoulder: Secondary | ICD-10-CM | POA: Diagnosis not present

## 2023-02-19 ENCOUNTER — Other Ambulatory Visit: Payer: Self-pay | Admitting: Family Medicine

## 2023-02-24 DIAGNOSIS — M67912 Unspecified disorder of synovium and tendon, left shoulder: Secondary | ICD-10-CM | POA: Diagnosis not present

## 2023-02-25 DIAGNOSIS — H40013 Open angle with borderline findings, low risk, bilateral: Secondary | ICD-10-CM | POA: Diagnosis not present

## 2023-02-26 DIAGNOSIS — H9201 Otalgia, right ear: Secondary | ICD-10-CM | POA: Diagnosis not present

## 2023-03-08 DIAGNOSIS — M25512 Pain in left shoulder: Secondary | ICD-10-CM | POA: Diagnosis not present

## 2023-03-15 DIAGNOSIS — M67912 Unspecified disorder of synovium and tendon, left shoulder: Secondary | ICD-10-CM | POA: Diagnosis not present

## 2023-03-26 ENCOUNTER — Ambulatory Visit (INDEPENDENT_AMBULATORY_CARE_PROVIDER_SITE_OTHER): Payer: Self-pay | Admitting: Plastic Surgery

## 2023-03-26 DIAGNOSIS — Z719 Counseling, unspecified: Secondary | ICD-10-CM

## 2023-03-26 NOTE — Progress Notes (Signed)
The patient is a 67 year old female here for evaluation of her face.  She thinks that her lips might be a little uneven.  Is a little hard to tell because she has negative facial asymmetry.  Overall she looks really good.  She had some dental work done so she accidentally bit her right lower lip.  I would recommend she waits until that gets healed and then we can certainly do some modifications.  She could also do some Botox as well.  She is going to let us know when she is ready to do anything further.  Pictures were obtained of the patient and placed in the chart with the patient's or guardian's permission.

## 2023-05-20 ENCOUNTER — Ambulatory Visit: Payer: Medicare PPO

## 2023-05-20 ENCOUNTER — Telehealth: Payer: Self-pay

## 2023-05-20 NOTE — Telephone Encounter (Signed)
Prolia VOB initiated via [email or web address]  Last Prolia inj: 11/19/22 Next Prolia inj DUE: 05/20/23

## 2023-05-20 NOTE — Telephone Encounter (Signed)
Hey I hate to bother you but I have let this slip thru my fingers . Can you run the pt for eligible for her Prolia so I can get one here she is schedule for tomorrow and I was not aware till now

## 2023-05-20 NOTE — Telephone Encounter (Signed)
Created new encounter for Prolia BIV. Will route encounter back once benefit verification is complete.  

## 2023-05-21 ENCOUNTER — Ambulatory Visit: Payer: Medicare PPO

## 2023-05-24 ENCOUNTER — Other Ambulatory Visit (HOSPITAL_COMMUNITY): Payer: Self-pay

## 2023-05-24 DIAGNOSIS — L298 Other pruritus: Secondary | ICD-10-CM | POA: Diagnosis not present

## 2023-05-24 DIAGNOSIS — L603 Nail dystrophy: Secondary | ICD-10-CM | POA: Diagnosis not present

## 2023-05-24 DIAGNOSIS — L918 Other hypertrophic disorders of the skin: Secondary | ICD-10-CM | POA: Diagnosis not present

## 2023-05-24 DIAGNOSIS — L821 Other seborrheic keratosis: Secondary | ICD-10-CM | POA: Diagnosis not present

## 2023-05-24 NOTE — Telephone Encounter (Signed)
Pharmacy Patient Advocate Encounter    PA for Prolia submitted to [facility] via CoverMyMeds Key or (Medicaid) confirmation # [id number] Status is pending

## 2023-05-24 NOTE — Telephone Encounter (Signed)
Abbott Pao created a new encounter on this matter

## 2023-05-24 NOTE — Telephone Encounter (Signed)
   [facility], unable to give me coinsurance amount. No deductible, PA required. Ref #: [id number]

## 2023-05-24 NOTE — Telephone Encounter (Signed)
Have we heard anything back on her benefits ?

## 2023-05-25 ENCOUNTER — Other Ambulatory Visit: Payer: Self-pay

## 2023-05-25 ENCOUNTER — Other Ambulatory Visit (HOSPITAL_COMMUNITY): Payer: Self-pay

## 2023-05-25 DIAGNOSIS — M810 Age-related osteoporosis without current pathological fracture: Secondary | ICD-10-CM

## 2023-05-25 MED ORDER — DENOSUMAB 60 MG/ML ~~LOC~~ SOSY: 60.0000 mg | PREFILLED_SYRINGE | Freq: Once | SUBCUTANEOUS | 0 refills | Status: AC

## 2023-05-25 NOTE — Telephone Encounter (Signed)
Pt is coming in on 05/26/23 for injection

## 2023-05-25 NOTE — Telephone Encounter (Signed)
Patient Advocate Encounter  Prior Authorization for Prolia 60MG /ML syringes has been approved with [employer].    PA# [id number] Effective dates: 11/12/21 through 11/23/23  Per [facility] test claim, copay for 180 days supply is $64

## 2023-05-25 NOTE — Telephone Encounter (Signed)
Pt ready for scheduling for PROLIA on or after : 05/25/23  Out-of-pocket cost due at time of visit: $--- [employer], could not give me coinsurance amount  Primary: [employer] Prolia co-insurance: --- Admin fee co-insurance: ---  Secondary: --- Prolia co-insurance:  Admin fee co-insurance:   Medical Benefit Details: Date Benefits were checked: 05/24/23 Deductible: NO/ Coinsurance: ---/ Admin Fee: ---  Prior Auth: APPROVED PA# [id number] Expiration Date: 11/13/23-11/23/2023   Pharmacy benefit: Copay $64 If patient wants fill through the pharmacy benefit please send prescription to: [facility], and include estimated need by date in rx notes. Pharmacy will ship medication directly to the office.  Patient NOT eligible for Prolia Copay Card. Copay Card can make patient's cost as little as $25. Link to apply: [email or web address]  ** This summary of benefits is an estimation of the patient's out-of-pocket cost. Exact cost may very based on individual plan coverage.

## 2023-05-25 NOTE — Telephone Encounter (Signed)
I have informed pt of the pharmacy team findings . She will call her insurance to see what her co pay at time of visit will be and Prolia has been sent to [facility] .

## 2023-05-26 ENCOUNTER — Ambulatory Visit: Payer: Medicare PPO

## 2023-05-26 DIAGNOSIS — M810 Age-related osteoporosis without current pathological fracture: Secondary | ICD-10-CM

## 2023-05-26 MED ORDER — DENOSUMAB 60 MG/ML ~~LOC~~ SOSY
60.0000 mg | PREFILLED_SYRINGE | Freq: Once | SUBCUTANEOUS | Status: AC
  Administered 2023-05-26: 60 mg via SUBCUTANEOUS

## 2023-05-26 NOTE — Progress Notes (Signed)
Pt presented today for prolia injection, no issues reported with previous injection and well tolerated today.

## 2023-06-11 ENCOUNTER — Encounter: Payer: Self-pay | Admitting: Family Medicine

## 2023-06-14 DIAGNOSIS — M9902 Segmental and somatic dysfunction of thoracic region: Secondary | ICD-10-CM | POA: Diagnosis not present

## 2023-06-14 DIAGNOSIS — M5414 Radiculopathy, thoracic region: Secondary | ICD-10-CM | POA: Diagnosis not present

## 2023-06-15 DIAGNOSIS — M5414 Radiculopathy, thoracic region: Secondary | ICD-10-CM | POA: Diagnosis not present

## 2023-06-15 DIAGNOSIS — M9902 Segmental and somatic dysfunction of thoracic region: Secondary | ICD-10-CM | POA: Diagnosis not present

## 2023-06-17 DIAGNOSIS — M5414 Radiculopathy, thoracic region: Secondary | ICD-10-CM | POA: Diagnosis not present

## 2023-06-17 DIAGNOSIS — M9902 Segmental and somatic dysfunction of thoracic region: Secondary | ICD-10-CM | POA: Diagnosis not present

## 2023-06-21 DIAGNOSIS — M5414 Radiculopathy, thoracic region: Secondary | ICD-10-CM | POA: Diagnosis not present

## 2023-06-21 DIAGNOSIS — M9902 Segmental and somatic dysfunction of thoracic region: Secondary | ICD-10-CM | POA: Diagnosis not present

## 2023-06-24 DIAGNOSIS — M5414 Radiculopathy, thoracic region: Secondary | ICD-10-CM | POA: Diagnosis not present

## 2023-06-24 DIAGNOSIS — M9902 Segmental and somatic dysfunction of thoracic region: Secondary | ICD-10-CM | POA: Diagnosis not present

## 2023-06-25 ENCOUNTER — Other Ambulatory Visit: Payer: Self-pay | Admitting: Family Medicine

## 2023-06-28 DIAGNOSIS — M5414 Radiculopathy, thoracic region: Secondary | ICD-10-CM | POA: Diagnosis not present

## 2023-06-28 DIAGNOSIS — M9902 Segmental and somatic dysfunction of thoracic region: Secondary | ICD-10-CM | POA: Diagnosis not present

## 2023-06-30 DIAGNOSIS — M9902 Segmental and somatic dysfunction of thoracic region: Secondary | ICD-10-CM | POA: Diagnosis not present

## 2023-06-30 DIAGNOSIS — M5414 Radiculopathy, thoracic region: Secondary | ICD-10-CM | POA: Diagnosis not present

## 2023-07-05 DIAGNOSIS — M5414 Radiculopathy, thoracic region: Secondary | ICD-10-CM | POA: Diagnosis not present

## 2023-07-05 DIAGNOSIS — M9902 Segmental and somatic dysfunction of thoracic region: Secondary | ICD-10-CM | POA: Diagnosis not present

## 2023-07-07 DIAGNOSIS — M5414 Radiculopathy, thoracic region: Secondary | ICD-10-CM | POA: Diagnosis not present

## 2023-07-07 DIAGNOSIS — M9902 Segmental and somatic dysfunction of thoracic region: Secondary | ICD-10-CM | POA: Diagnosis not present

## 2023-07-12 DIAGNOSIS — M9902 Segmental and somatic dysfunction of thoracic region: Secondary | ICD-10-CM | POA: Diagnosis not present

## 2023-07-12 DIAGNOSIS — M5414 Radiculopathy, thoracic region: Secondary | ICD-10-CM | POA: Diagnosis not present

## 2023-07-14 DIAGNOSIS — M5414 Radiculopathy, thoracic region: Secondary | ICD-10-CM | POA: Diagnosis not present

## 2023-07-14 DIAGNOSIS — M9902 Segmental and somatic dysfunction of thoracic region: Secondary | ICD-10-CM | POA: Diagnosis not present

## 2023-07-27 DIAGNOSIS — M9902 Segmental and somatic dysfunction of thoracic region: Secondary | ICD-10-CM | POA: Diagnosis not present

## 2023-07-27 DIAGNOSIS — M5414 Radiculopathy, thoracic region: Secondary | ICD-10-CM | POA: Diagnosis not present

## 2023-07-28 DIAGNOSIS — M25512 Pain in left shoulder: Secondary | ICD-10-CM | POA: Diagnosis not present

## 2023-07-28 DIAGNOSIS — M25511 Pain in right shoulder: Secondary | ICD-10-CM | POA: Diagnosis not present

## 2023-08-03 DIAGNOSIS — L603 Nail dystrophy: Secondary | ICD-10-CM | POA: Diagnosis not present

## 2023-08-04 ENCOUNTER — Ambulatory Visit (INDEPENDENT_AMBULATORY_CARE_PROVIDER_SITE_OTHER): Payer: Medicare PPO | Admitting: *Deleted

## 2023-08-04 DIAGNOSIS — Z0000 Encounter for general adult medical examination without abnormal findings: Secondary | ICD-10-CM | POA: Diagnosis not present

## 2023-08-04 NOTE — Patient Instructions (Signed)
Ms. Sandra Mora , Thank you for taking time to come for your Medicare Wellness Visit. I appreciate your ongoing commitment to your health goals. Please review the following plan we discussed and let me know if I can assist you in the future.   Screening recommendations/referrals: Colonoscopy: up to date Mammogram: up to date Bone Density: up to date Recommended yearly ophthalmology/optometry visit for glaucoma screening and checkup Recommended yearly dental visit for hygiene and checkup  Vaccinations: Influenza vaccine: Education provided Pneumococcal vaccine: Education  provided Tdap vaccine: up to date Shingles vaccine: Education provided    Advanced directives: yes not on file     Preventive Care 65 Years and Older, Female Preventive care refers to lifestyle choices and visits with your health care provider that can promote health and wellness. What does preventive care include? A yearly physical exam. This is also called an annual well check. Dental exams once or twice a year. Routine eye exams. Ask your health care provider how often you should have your eyes checked. Personal lifestyle choices, including: Daily care of your teeth and gums. Regular physical activity. Eating a healthy diet. Avoiding tobacco and drug use. Limiting alcohol use. Practicing safe sex. Taking low-dose aspirin every day. Taking vitamin and mineral supplements as recommended by your health care provider. What happens during an annual well check? The services and screenings done by your health care provider during your annual well check will depend on your age, overall health, lifestyle risk factors, and family history of disease. Counseling  Your health care provider may ask you questions about your: Alcohol use. Tobacco use. Drug use. Emotional well-being. Home and relationship well-being. Sexual activity. Eating habits. History of falls. Memory and ability to understand (cognition). Work and  work Astronomer. Reproductive health. Screening  You may have the following tests or measurements: Height, weight, and BMI. Blood pressure. Lipid and cholesterol levels. These may be checked every 5 years, or more frequently if you are over 21 years old. Skin check. Lung cancer screening. You may have this screening every year starting at age 41 if you have a 30-pack-year history of smoking and currently smoke or have quit within the past 15 years. Fecal occult blood test (FOBT) of the stool. You may have this test every year starting at age 35. Flexible sigmoidoscopy or colonoscopy. You may have a sigmoidoscopy every 5 years or a colonoscopy every 10 years starting at age 62. Hepatitis C blood test. Hepatitis B blood test. Sexually transmitted disease (STD) testing. Diabetes screening. This is done by checking your blood sugar (glucose) after you have not eaten for a while (fasting). You may have this done every 1-3 years. Bone density scan. This is done to screen for osteoporosis. You may have this done starting at age 52. Mammogram. This may be done every 1-2 years. Talk to your health care provider about how often you should have regular mammograms. Talk with your health care provider about your test results, treatment options, and if necessary, the need for more tests. Vaccines  Your health care provider may recommend certain vaccines, such as: Influenza vaccine. This is recommended every year. Tetanus, diphtheria, and acellular pertussis (Tdap, Td) vaccine. You may need a Td booster every 10 years. Zoster vaccine. You may need this after age 58. Pneumococcal 13-valent conjugate (PCV13) vaccine. One dose is recommended after age 44. Pneumococcal polysaccharide (PPSV23) vaccine. One dose is recommended after age 41. Talk to your health care provider about which screenings and vaccines you need and  how often you need them. This information is not intended to replace advice given to you  by your health care provider. Make sure you discuss any questions you have with your health care provider. Document Released: 12/06/2015 Document Revised: 07/29/2016 Document Reviewed: 09/10/2015 Elsevier Interactive Patient Education  2017 [employer].  Fall Prevention in the Home Falls can cause injuries. They can happen to people of all ages. There are many things you can do to make your home safe and to help prevent falls. What can I do on the outside of my home? Regularly fix the edges of walkways and driveways and fix any cracks. Remove anything that might make you trip as you walk through a door, such as a raised step or threshold. Trim any bushes or trees on the path to your home. Use bright outdoor lighting. Clear any walking paths of anything that might make someone trip, such as rocks or tools. Regularly check to see if handrails are loose or broken. Make sure that both sides of any steps have handrails. Any raised decks and porches should have guardrails on the edges. Have any leaves, snow, or ice cleared regularly. Use sand or salt on walking paths during winter. Clean up any spills in your garage right away. This includes oil or grease spills. What can I do in the bathroom? Use night lights. Install grab bars by the toilet and in the tub and shower. Do not use towel bars as grab bars. Use non-skid mats or decals in the tub or shower. If you need to sit down in the shower, use a plastic, non-slip stool. Keep the floor dry. Clean up any water that spills on the floor as soon as it happens. Remove soap buildup in the tub or shower regularly. Attach bath mats securely with double-sided non-slip rug tape. Do not have throw rugs and other things on the floor that can make you trip. What can I do in the bedroom? Use night lights. Make sure that you have a light by your bed that is easy to reach. Do not use any sheets or blankets that are too big for your bed. They should not  hang down onto the floor. Have a firm chair that has side arms. You can use this for support while you get dressed. Do not have throw rugs and other things on the floor that can make you trip. What can I do in the kitchen? Clean up any spills right away. Avoid walking on wet floors. Keep items that you use a lot in easy-to-reach places. If you need to reach something above you, use a strong step stool that has a grab bar. Keep electrical cords out of the way. Do not use floor polish or wax that makes floors slippery. If you must use wax, use non-skid floor wax. Do not have throw rugs and other things on the floor that can make you trip. What can I do with my stairs? Do not leave any items on the stairs. Make sure that there are handrails on both sides of the stairs and use them. Fix handrails that are broken or loose. Make sure that handrails are as long as the stairways. Check any carpeting to make sure that it is firmly attached to the stairs. Fix any carpet that is loose or worn. Avoid having throw rugs at the top or bottom of the stairs. If you do have throw rugs, attach them to the floor with carpet tape. Make sure that you have  a light switch at the top of the stairs and the bottom of the stairs. If you do not have them, ask someone to add them for you. What else can I do to help prevent falls? Wear shoes that: Do not have high heels. Have rubber bottoms. Are comfortable and fit you well. Are closed at the toe. Do not wear sandals. If you use a stepladder: Make sure that it is fully opened. Do not climb a closed stepladder. Make sure that both sides of the stepladder are locked into place. Ask someone to hold it for you, if possible. Clearly mark and make sure that you can see: Any grab bars or handrails. First and last steps. Where the edge of each step is. Use tools that help you move around (mobility aids) if they are needed. These  include: Canes. Walkers. Scooters. Crutches. Turn on the lights when you go into a dark area. Replace any light bulbs as soon as they burn out. Set up your furniture so you have a clear path. Avoid moving your furniture around. If any of your floors are uneven, fix them. If there are any pets around you, be aware of where they are. Review your medicines with your doctor. Some medicines can make you feel dizzy. This can increase your chance of falling. Ask your doctor what other things that you can do to help prevent falls. This information is not intended to replace advice given to you by your health care provider. Make sure you discuss any questions you have with your health care provider. Document Released: 09/05/2009 Document Revised: 04/16/2016 Document Reviewed: 12/14/2014 Elsevier Interactive Patient Education  2017 [employer].

## 2023-08-04 NOTE — Progress Notes (Signed)
Subjective:   Sandra Mora is a 67 y.o. female who presents for Medicare Annual (Subsequent) preventive examination.  Visit Complete: Virtual  I connected with  Sandra Mora on 08/04/23 by a audio enabled telemedicine application and verified that I am speaking with the correct person using two identifiers.  Patient Location: Home  Provider Location: Home Office  I discussed the limitations of evaluation and management by telemedicine. The patient expressed understanding and agreed to proceed.  Patient Medicare AWV questionnaire was completed by the patient on 07-31-2023; I have confirmed that all information answered by patient is correct and no changes since this date.  Vital Signs: Unable to obtain new vitals due to this being a telehealth visit.   Review of Systems     Cardiac Risk Factors include: advanced age (>64men, >76 women);family history of premature cardiovascular disease;hypertension     Objective:    Today's Vitals   08/04/23 0933  PainSc: 2    There is no height or weight on file to calculate BMI.     08/04/2023    9:35 AM 08/20/2022    8:19 AM 10/28/2015    8:32 PM  Advanced Directives  Does Patient Have a Medical Advance Directive? Yes Yes No  Type of Estate agent of [employer] Power of [place];Living will   Copy of Healthcare Power of Attorney in Chart?  No - copy requested     Current Medications (verified) Outpatient Encounter Medications as of 08/04/2023  Medication Sig   buPROPion (WELLBUTRIN) 75 MG tablet TAKE 1 TABLET BY MOUTH TWICE A DAY   Calcium Carbonate-Vitamin D (CALCIUM 600 + D PO) Take by mouth daily.   Cholecalciferol (VITAMIN D) 2000 UNITS CAPS Take 4,000 Units by mouth daily.   magnesium oxide (MAG-OX) 400 MG tablet Take 400 mg by mouth daily.   Multiple Vitamin (MULTIVITAMIN) tablet Take 1 tablet by mouth daily.   Omega-3 Fatty Acids (FISH OIL PO) Take 2 Capfuls by mouth daily.   Probiotic Product  (PROBIOTIC DAILY PO) Take by mouth.   pyridOXINE (VITAMIN B-6) 100 MG tablet Vitamin B6   SUMAtriptan (IMITREX) 25 MG tablet TAKE 1 TABLET BY MOUTH EVERY 2 HOURS AS NEEDED FOR MIGRAINE. MAY REPEAT IN 2 HOURS IF HEADACHE PERSISTS OR RECUTS   fluticasone (FLONASE) 50 MCG/ACT nasal spray Place into the nose.   No facility-administered encounter medications on file as of 08/04/2023.    Allergies (verified) Codeine   History: Past Medical History:  Diagnosis Date   Allergy    seasonal   Migraine    Osteoporosis    Spine -3 (2013)   Past Surgical History:  Procedure Laterality Date   ABDOMINAL HYSTERECTOMY     COSMETIC SURGERY     Family History  Problem Relation Age of Onset   Heart disease Mother    Heart attack Father    Heart disease Father    Arrhythmia Sister    Stroke Sister    Social History   Socioeconomic History   Marital status: Married    Spouse name: Not on file   Number of children: 2   Years of education: Not on file   Highest education level: Not on file  Occupational History   Occupation: occupational therapist    Employer: [employer]  Tobacco Use   Smoking status: Never   Smokeless tobacco: Never  Substance and Sexual Activity   Alcohol use: No   Drug use: No   Sexual activity:  Not Currently  Other Topics Concern   Not on file  Social History Narrative   Not on file   Social Determinants of Health   Financial Resource Strain: Low Risk  (08/04/2023)   Overall Financial Resource Strain (CARDIA)    Difficulty of Paying Living Expenses: Not hard at all  Food Insecurity: No Food Insecurity (08/04/2023)   Hunger Vital Sign    Worried About Running Out of Food in the Last Year: Never true    Ran Out of Food in the Last Year: Never true  Transportation Needs: No Transportation Needs (08/04/2023)   PRAPARE - Administrator, Civil Service (Medical): No    Lack of Transportation (Non-Medical): No  Physical Activity: Sufficiently  Active (08/04/2023)   Exercise Vital Sign    Days of Exercise per Week: 3 days    Minutes of Exercise per Session: 60 min  Stress: No Stress Concern Present (08/04/2023)   [employer] of Occupational Health - Occupational Stress Questionnaire    Feeling of Stress : Not at all  Social Connections: Socially Integrated (08/04/2023)   Social Connection and Isolation Panel [NHANES]    Frequency of Communication with Friends and Family: More than three times a week    Frequency of Social Gatherings with Friends and Family: Three times a week    Attends Religious Services: More than 4 times per year    Active Member of Clubs or Organizations: Yes    Attends Engineer, structural: More than 4 times per year    Marital Status: Married    Tobacco Counseling Counseling given: Not Answered   Clinical Intake:  Pre-visit preparation completed: Yes  Pain : 0-10 Pain Score: 2  Pain Type: Chronic pain Pain Location: Shoulder Pain Orientation: Left Pain Descriptors / Indicators: Aching, Burning Pain Onset: More than a month ago Pain Frequency: Intermittent     Diabetes: No  How often do you need to have someone help you when you read instructions, pamphlets, or other written materials from your doctor or pharmacy?: 1 - Never  Interpreter Needed?: No  Information entered by :: Remi Haggard LPN   Activities of Daily Living    08/04/2023    9:35 AM 07/31/2023    5:30 PM  In your present state of health, do you have any difficulty performing the following activities:  Hearing? 0 0  Vision? 0 0  Difficulty concentrating or making decisions? 0 0  Walking or climbing stairs? 0 0  Dressing or bathing? 0 0  Doing errands, shopping? 0 0  Preparing Food and eating ? N N  Using the Toilet? N N  In the past six months, have you accidently leaked urine? N N  Do you have problems with loss of bowel control? N N  Managing your Medications? N N  Managing your Finances? N N   Housekeeping or managing your Housekeeping? N N    Patient Care Team: Sheliah Hatch, MD as PCP - General (Family Medicine) [place], Amy, MD as Consulting Physician (Dermatology) Zelphia Cairo, MD as Consulting Physician (Obstetrics and Gynecology) Marzella Schlein., MD as Consulting Physician (Ophthalmology) Charna Elizabeth, MD as Consulting Physician (Gastroenterology)  Indicate any recent Medical Services you may have received from other than [facility] providers in the past year (date may be approximate).     Assessment:   This is a routine wellness examination for [place].  Hearing/Vision screen Hearing Screening - Comments:: No trouble hearing Vision Screening - Comments:: Up to  date [facility]   Goals Addressed             This Visit's Progress    DIET - INCREASE WATER INTAKE   On track    Increase physical activity         Depression Screen    08/04/2023    9:38 AM 08/20/2022    8:18 AM 08/03/2022   10:33 AM 06/25/2022    8:18 AM 08/21/2021    1:37 PM 09/16/2020    7:39 AM 10/09/2019    4:03 PM  PHQ 2/9 Scores  PHQ - 2 Score 0 0 0 0 0 0 0  PHQ- 9 Score 0 0 0 6 0 0     Fall Risk    08/04/2023    9:35 AM 07/31/2023    5:30 PM 08/20/2022    8:16 AM 08/03/2022   10:32 AM 06/25/2022    8:18 AM  Fall Risk   Falls in the past year? 0 0 0 0 0  Number falls in past yr: 0 0 0 0 0  Injury with Fall? 0 0 0 0 0  Risk for fall due to :   No Fall Risks No Fall Risks No Fall Risks  Follow up Falls evaluation completed;Education provided;Falls prevention discussed  Falls prevention discussed Falls evaluation completed Falls evaluation completed    MEDICARE RISK AT HOME: Medicare Risk at Home Any stairs in or around the home?: Yes If so, are there any without handrails?: No Home free of loose throw rugs in walkways, pet beds, electrical cords, etc?: Yes Adequate lighting in your home to reduce risk of falls?: Yes Life alert?: No Use of a cane, walker or w/c?: No Grab bars in the  bathroom?: No Shower chair or bench in shower?: No Elevated toilet seat or a handicapped toilet?: No  TIMED UP AND GO:  Was the test performed?  No    Cognitive Function:        08/20/2022    8:20 AM  6CIT Screen  What Year? 0 points  What month? 0 points  What time? 0 points  Count back from 20 0 points  Months in reverse 0 points  Repeat phrase 2 points  Total Score 2 points    Immunizations Immunization History  Administered Date(s) Administered   Fluad Quad(high Dose 65+) 09/29/2022   Hep B, Unspecified 11/23/2000   Hepatitis A, Adult 10/29/2000   Influenza,inj,Quad PF,6+ Mos 09/22/2016, 10/04/2017, 09/12/2018, 09/16/2020   Influenza-Unspecified 08/23/2020, 09/23/2021   PFIZER(Purple Top)SARS-COV-2 Vaccination 01/26/2020, 02/16/2020   Tdap 10/26/2010, 08/23/2020, 09/16/2020   Unspecified SARS-COV-2 Vaccination 02/01/2019, 01/26/2020    TDAP status: Up to date  Flu Vaccine status: Due, Education has been provided regarding the importance of this vaccine. Advised may receive this vaccine at local pharmacy or Health Dept. Aware to provide a copy of the vaccination record if obtained from local pharmacy or Health Dept. Verbalized acceptance and understanding.  Pneumococcal vaccine status: Due, Education has been provided regarding the importance of this vaccine. Advised may receive this vaccine at local pharmacy or Health Dept. Aware to provide a copy of the vaccination record if obtained from local pharmacy or Health Dept. Verbalized acceptance and understanding.  Covid-19 vaccine status: Information provided on how to obtain vaccines.   Qualifies for Shingles Vaccine? Yes   Zostavax completed No   Shingrix Completed?: No.    Education has been provided regarding the importance of this vaccine. Patient has been advised to call insurance company to  determine out of pocket expense if they have not yet received this vaccine. Advised may also receive vaccine at local  pharmacy or Health Dept. Verbalized acceptance and understanding.  Screening Tests Health Maintenance  Topic Date Due   MAMMOGRAM  08/04/2023 (Originally 07/10/2022)   COVID-19 Vaccine (5 - 2023-24 season) 08/20/2023 (Originally 07/25/2023)   Zoster Vaccines- Shingrix (1 of 2) 11/03/2023 (Originally 08/18/2006)   INFLUENZA VACCINE  02/21/2024 (Originally 06/24/2023)   Pneumonia Vaccine 44+ Years old (1 of 1 - PCV) 08/03/2024 (Originally 08/18/2021)   Medicare Annual Wellness (AWV)  08/03/2024   Colonoscopy  06/27/2029   DTaP/Tdap/Td (4 - Td or Tdap) 09/16/2030   DEXA SCAN  Completed   Hepatitis C Screening  Completed   HPV VACCINES  Aged Out    Health Maintenance  There are no preventive care reminders to display for this patient.   Colorectal cancer screening: Type of screening: Colonoscopy. Completed 2020. Repeat every 10 years  Mammogram status: Completed  . Repeat every year  Bone Density status: Completed  . Results reflect: Bone density results: OSTEOPENIA. Repeat every 2 years.  Lung Cancer Screening: (Low Dose CT Chest recommended if Age 20-80 years, 20 pack-year currently smoking OR have quit w/in 15years.) does not qualify.   Lung Cancer Screening Referral:   Additional Screening:  Hepatitis C Screening: does not qualify; Completed 2021  Vision Screening: Recommended annual ophthalmology exams for early detection of glaucoma and other disorders of the eye. Is the patient up to date with their annual eye exam?  Yes  Who is the provider or what is the name of the office in which the patient attends annual eye exams? [facility] If pt is not established with a provider, would they like to be referred to a provider to establish care? No .   Dental Screening: Recommended annual dental exams for proper oral hygiene    Community Resource Referral / Chronic Care Management: CRR required this visit?  No   CCM required this visit?  No     Plan:     I have personally reviewed  and noted the following in the patient's chart:   Medical and social history Use of alcohol, tobacco or illicit drugs  Current medications and supplements including opioid prescriptions. Patient is not currently taking opioid prescriptions. Functional ability and status Nutritional status Physical activity Advanced directives List of other physicians Hospitalizations, surgeries, and ER visits in previous 12 months Vitals Screenings to include cognitive, depression, and falls Referrals and appointments  In addition, I have reviewed and discussed with patient certain preventive protocols, quality metrics, and best practice recommendations. A written personalized care plan for preventive services as well as general preventive health recommendations were provided to patient.     Remi Haggard, LPN   1/61/0960   After Visit Summary: (MyChart) Due to this being a telephonic visit, the after visit summary with patients personalized plan was offered to patient via MyChart   Nurse Notes:

## 2023-08-05 DIAGNOSIS — H40013 Open angle with borderline findings, low risk, bilateral: Secondary | ICD-10-CM | POA: Diagnosis not present

## 2023-08-05 DIAGNOSIS — M9902 Segmental and somatic dysfunction of thoracic region: Secondary | ICD-10-CM | POA: Diagnosis not present

## 2023-08-05 DIAGNOSIS — M5414 Radiculopathy, thoracic region: Secondary | ICD-10-CM | POA: Diagnosis not present

## 2023-08-17 DIAGNOSIS — M9902 Segmental and somatic dysfunction of thoracic region: Secondary | ICD-10-CM | POA: Diagnosis not present

## 2023-08-17 DIAGNOSIS — M5414 Radiculopathy, thoracic region: Secondary | ICD-10-CM | POA: Diagnosis not present

## 2023-08-24 ENCOUNTER — Ambulatory Visit (HOSPITAL_BASED_OUTPATIENT_CLINIC_OR_DEPARTMENT_OTHER): Payer: Medicare PPO | Admitting: Physical Therapy

## 2023-08-26 ENCOUNTER — Other Ambulatory Visit: Payer: Self-pay

## 2023-08-26 ENCOUNTER — Ambulatory Visit (HOSPITAL_BASED_OUTPATIENT_CLINIC_OR_DEPARTMENT_OTHER): Payer: Medicare PPO | Attending: Orthopedic Surgery | Admitting: Physical Therapy

## 2023-08-26 DIAGNOSIS — M25512 Pain in left shoulder: Secondary | ICD-10-CM | POA: Diagnosis not present

## 2023-08-26 DIAGNOSIS — M6281 Muscle weakness (generalized): Secondary | ICD-10-CM | POA: Insufficient documentation

## 2023-08-26 DIAGNOSIS — M25612 Stiffness of left shoulder, not elsewhere classified: Secondary | ICD-10-CM | POA: Diagnosis not present

## 2023-08-26 NOTE — Therapy (Addendum)
OUTPATIENT PHYSICAL THERAPY SHOULDER EVALUATION   Patient Name: Sandra Mora MRN: [id number] DOB:[birth year], 67 y.o., female Today's Date: 08/27/2023  END OF SESSION:  PT End of Session - 08/26/23 1030     Visit Number 1    Number of Visits 12    Date for PT Re-Evaluation 10/07/23    Authorization Type [employer]    PT Start Time 0935    PT Stop Time 1027    PT Time Calculation (min) 52 min    Activity Tolerance Patient tolerated treatment well    Behavior During Therapy [facility] for tasks assessed/performed             Past Medical History:  Diagnosis Date   Allergy    seasonal   Migraine    Osteoporosis    Spine -3 (2013)   Past Surgical History:  Procedure Laterality Date   ABDOMINAL HYSTERECTOMY     COSMETIC SURGERY     Patient Active Problem List   Diagnosis Date Noted   Anxiety 06/28/2022   Family history of embolic stroke 06/28/2022   PDA (patent ductus arteriosus) 06/28/2022   Gastroesophageal reflux disease 01/15/2021   Encounter for counseling 01/15/2021   Osteoporosis    Allergic rhinitis 08/06/2014   Physical exam, annual 07/27/2012   Migraine      REFERRING PROVIDER: Jones Broom, MD  REFERRING DIAG: Left shoulder cuff tendonosis stiffness  THERAPY DIAG:  Left shoulder pain, unspecified chronicity  Muscle weakness (generalized)  Stiffness of left shoulder, not elsewhere classified  Rationale for Evaluation and Treatment: Rehabilitation  ONSET DATE: Exacerbation in March 2024  SUBJECTIVE:                                                                                                                                                                                      SUBJECTIVE STATEMENT: Pt states she has not had a cuff repair or any type of shoulder surgery.    Pt states she began having L shoulder pain about 1 year ago when she [place]crum[place] the [place].  Pt continued to have pain.  Pt began working out at [employer] in  January.  Pt states she didn't have increased pain with gym exercises except with the shoulder press machine and chest press machine.  She stopped using the shoulder press machine and only uses the R UE with chest press machine.  Pt began having biceps pain after workouts and stopped performing elbow flexion exercises.  Her pain worsened in March and she went to see ortho MD.  Pt states her shoulder feels better since not performing elbow flexion exercises.  Pt has received 2  cortisone shots and received greater pain relief with the 2nd injection.   Pt is very limited with lifting.  She has pain with lifting grocery bags, a gallon of milk, and her grandchild.  She has to modify how she picks up her grandchild.  Pt is limited with reaching behind back and has to move her arm slow with abduction.  Pt has soreness with grabbing seatbelt and driving.  She has soreness with pulling off sports bra.  Pt is unable to sleep on L side.    Hand dominance: Right  PERTINENT HISTORY: MRI findings of posterosuperior labral tear Osteoporosis which has improved to osteopenia  Migraines due to environmental/bariatric pressures changes per pt  PAIN:  Pt states she is having anterior shoulder and biceps pain.  Pt also having cervical and UT pain.  Pt states her neck will bother her more than her shoulder at times.  Pt feels spasms in her neck.    NPRS:  1-2/10 current at rest, 7/10 worst, 0/10 best Location: anterior, lateral, and posterior shoulder  PRECAUTIONS: Other: per MRI findings of posterosuperior labral tear, osteopenia   WEIGHT BEARING RESTRICTIONS: No  FALLS:  Has patient fallen in last 6 months? No  LIVING ENVIRONMENT: Lives with: lives with their spouse   OCCUPATION: Pt is a retired Pharmacist, community.  PLOF: Independent.  Pt has chronic shoulder pain.   PATIENT GOALS:  to be pain free, full function, full ROM   OBJECTIVE:   DIAGNOSTIC FINDINGS:  MRI per (MD note): -Prior cuff repair.  Moderate  distal supraspinatus and infraspinatus tendinosis with bursal side fraying.  No high grade or retracted cuff tear -mild subacromial subdeltoid bursitis -Mild GH and AC joint OA -posterosuperior labral tear through the labral base -mild intra-articular long head biceps tendinosis  PATIENT SURVEYS:  FOTO 63 with a goal of 68 at visit 10  COGNITION: Overall cognitive status: Within functional limits for tasks assessed      UPPER EXTREMITY ROM:   AROM/PROM Right eval Left eval  Shoulder flexion 180 165  Shoulder scaption 178 158 with pain  Shoulder abduction 168 154 with pain  Shoulder adduction    Shoulder internal rotation 72 66  Shoulder external rotation 90 63/68  Elbow flexion    Elbow extension    Wrist flexion    Wrist extension    Wrist ulnar deviation    Wrist radial deviation    Wrist pronation    Wrist supination    (Blank rows = not tested)  UPPER EXTREMITY MMT:  MMT Right eval Left eval  Shoulder flexion 5/5 4+/5  Shoulder scaption 5/5 4/5  Shoulder abduction 5/5 weak  Shoulder adduction    Shoulder internal rotation 5/5 5/5  Shoulder external rotation 5/5 4+/5  Middle trapezius    Lower trapezius    Elbow flexion 5/5 4+/5  Elbow extension    Wrist flexion    Wrist extension    Wrist ulnar deviation    Wrist radial deviation    Wrist pronation    Wrist supination    Grip strength (lbs)    (Blank rows = not tested)  SHOULDER SPECIAL TESTS: Impingement tests: Neer's Impingement test:  R:  negative, L: positive Rotator cuff assessment: ER lag test:  R:  negative, L:  pt able to hold without difficulty though does have pain     PALPATION:  TTP:  L anterior and posterior shoulder, UT   TODAY'S TREATMENT:  Pt has been performing T band ER and IR and T band shoulder extension.  Pt performed shoulder ABC x 1  rep.  Pt received a HEP handout and was educated in correct form and appropriate frequency.    PATIENT EDUCATION: Education details: dx, relevant anatomy, POC, objective findings, HEP, and rationale of exercises.  PT answered pt's questions.  Person educated: Patient Education method: Explanation, Demonstration, Verbal cues, and Handouts Education comprehension: verbalized understanding, returned demonstration, verbal cues required, and needs further education  HOME EXERCISE PROGRAM: Access Code: [email or web address] URL: [email or web address] Date: 08/27/2023 Prepared by: Aaron Edelman  Exercises - Supine Shoulder Alphabet  - 1 x daily - 7 x weekly - 1 reps  ASSESSMENT:  CLINICAL IMPRESSION: Patient is a 67 y.o. female with a dx of L shoulder cuff tendonosis and tightness presenting to the clinic with L shoulder pain, muscle weakness in L UE, and limited ROM in L shoulder.  Pt has had L shoulder pain for 1 year and had an exacerbation in March.  Pt reports having improved pain since stopping elbow flexion exercises in the gym.  Pt is very limited with lifting and has pain with lifting grocery bags and her grandchild.  Pt is limited with reaching behind back and has soreness with grabbing seatbelt and driving.  She is limited with gym exercises.  Pt should benefit from skilled PT services to address impairments and to improve overall function.   OBJECTIVE IMPAIRMENTS: decreased ROM, decreased strength, hypomobility, impaired flexibility, impaired UE functional use, and pain.   ACTIVITY LIMITATIONS: carrying, lifting, sleeping, dressing, and caring for others  PARTICIPATION LIMITATIONS: driving  PERSONAL FACTORS: 1 comorbidity: osteopenia  are also affecting patient's functional outcome.   REHAB POTENTIAL: Good  CLINICAL DECISION MAKING: Stable/uncomplicated  EVALUATION COMPLEXITY: Low   GOALS:   SHORT TERM GOALS: Target date: 09/16/2023   Pt will be independent and  compliant with HEP for improved pain, strength, ROM, and function.  Baseline: Goal status: INITIAL  2.  Pt will report at least a 25% improvement in pain and sx's overall. Baseline:  Goal status: INITIAL    LONG TERM GOALS: Target date: 10/07/2023   Pt will be able to lift grocery bags without significant pain and difficulty.  Baseline:  Goal status: INITIAL  2.  Pt will demo improved L shoulder flexion, ER, and scaption strength to 5/5 MMT for improved tolerance with and performance of functional lifting and carrying.   Baseline:  Goal status: INITIAL  3.  Pt will be able to drive without increased pain.   Baseline:  Goal status: INITIAL  4.  Pt will report improved ease and reduced pain with her normal functional reaching activities.  Baseline:  Goal status: INITIAL    PLAN:  PT FREQUENCY: 2x/wk  PT DURATION: 6 weeks  PLANNED INTERVENTIONS: Therapeutic exercises, Therapeutic activity, Neuromuscular re-education, Patient/Family education, Self Care, Aquatic Therapy, Dry Needling, Electrical stimulation, Cryotherapy, Moist heat, Taping, Ultrasound, Manual therapy, and Re-evaluation  PLAN FOR NEXT SESSION: Cont with shoulder and scapular strengthening and stabilization with precautions concerning posterosuperior labral tear.  Perform supine rhythmic stab's next visit.    Referring diagnosis? Left shoulder cuff tendonosis stiffness  Treatment diagnosis? (if different than referring diagnosis)  Left shoulder pain, unspecified chronicity  Muscle weakness (generalized)  Stiffness of left shoulder, not elsewhere classified  What was this (referring dx) caused by? []  Surgery []  Fall []  Ongoing issue []  Arthritis []  Other: ____________  Laterality: []  Rt [x]  Lt []  Both  Check  all possible CPT codes:  *CHOOSE 10 OR LESS*    [x]  97110 (Therapeutic Exercise)  []  92507 (SLP Treatment)  [x]  97112 (Neuro Re-ed)   []  92526 (Swallowing Treatment)   []  97116 (Gait  Training)   []  [phone] (Cognitive Training, 1st 15 minutes) [x]  97140 (Manual Therapy)   []  97130 (Cognitive Training, each add'l 15 minutes)  [x]  97164 (Re-evaluation)                              []  Other, List CPT Code ____________  [x]  97530 (Therapeutic Activities)     [x]  97535 (Self Care)   []  All codes above (97110 - 97535)  []  97012 (Mechanical Traction)  [x]  97014 (E-stim Unattended)  [x]  97032 (E-stim manual)  []  97033 (Ionto)  [x]  97035 (Ultrasound) [x]  97750 (Physical Performance Training) [x]  [id number] (Aquatic Therapy) []  97016 (Vasopneumatic Device) []  [id number] (Paraffin) []  97034 (Contrast Bath) []  97597 (Wound Care 1st 20 sq cm) []  97598 (Wound Care each add'l 20 sq cm) []  97760 (Orthotic Fabrication, Fitting, Training Initial) []  [id number] (Prosthetic Management and Training Initial) []  [id number] (Orthotic or Prosthetic Training/ Modification Subsequent)  Audie Clear III PT, DPT 08/27/23 4:59 PM

## 2023-08-27 ENCOUNTER — Encounter (HOSPITAL_BASED_OUTPATIENT_CLINIC_OR_DEPARTMENT_OTHER): Payer: Self-pay | Admitting: Physical Therapy

## 2023-08-31 DIAGNOSIS — M9902 Segmental and somatic dysfunction of thoracic region: Secondary | ICD-10-CM | POA: Diagnosis not present

## 2023-08-31 DIAGNOSIS — M5414 Radiculopathy, thoracic region: Secondary | ICD-10-CM | POA: Diagnosis not present

## 2023-09-03 ENCOUNTER — Ambulatory Visit (HOSPITAL_BASED_OUTPATIENT_CLINIC_OR_DEPARTMENT_OTHER): Payer: Medicare PPO | Admitting: Physical Therapy

## 2023-09-03 ENCOUNTER — Encounter (HOSPITAL_BASED_OUTPATIENT_CLINIC_OR_DEPARTMENT_OTHER): Payer: Self-pay | Admitting: Physical Therapy

## 2023-09-03 DIAGNOSIS — M25512 Pain in left shoulder: Secondary | ICD-10-CM

## 2023-09-03 DIAGNOSIS — M6281 Muscle weakness (generalized): Secondary | ICD-10-CM

## 2023-09-03 DIAGNOSIS — M25612 Stiffness of left shoulder, not elsewhere classified: Secondary | ICD-10-CM

## 2023-09-03 NOTE — Therapy (Signed)
OUTPATIENT PHYSICAL THERAPY SHOULDER EVALUATION   Patient Name: Sandra Mora MRN: [id number] DOB:Aug 01, 1956, 67 y.o., female Today's Date: 09/03/2023  END OF SESSION:  PT End of Session - 09/03/23 1524     Visit Number 2    Number of Visits 12    Date for PT Re-Evaluation 10/07/23    Authorization Type [employer]    Authorization Time Period 12 visits approved 10/3-11/30    PT Start Time 1524    PT Stop Time 1600    PT Time Calculation (min) 36 min    Activity Tolerance Patient tolerated treatment well    Behavior During Therapy [facility] for tasks assessed/performed             Past Medical History:  Diagnosis Date   Allergy    seasonal   Migraine    Osteoporosis    Spine -3 (2013)   Past Surgical History:  Procedure Laterality Date   ABDOMINAL HYSTERECTOMY     COSMETIC SURGERY     Patient Active Problem List   Diagnosis Date Noted   Anxiety 06/28/2022   Family history of embolic stroke 06/28/2022   PDA (patent ductus arteriosus) 06/28/2022   Gastroesophageal reflux disease 01/15/2021   Encounter for counseling 01/15/2021   Osteoporosis    Allergic rhinitis 08/06/2014   Physical exam, annual 07/27/2012   Migraine      REFERRING PROVIDER: Jones Broom, MD  REFERRING DIAG: Left shoulder cuff tendonosis stiffness  THERAPY DIAG:  Left shoulder pain, unspecified chronicity  Muscle weakness (generalized)  Stiffness of left shoulder, not elsewhere classified  Rationale for Evaluation and Treatment: Rehabilitation  ONSET DATE: Exacerbation in March 2024  SUBJECTIVE:                                                                                                                                                                                      SUBJECTIVE STATEMENT: Pain in neck and shoulder today.  EVAL: Pt states she has not had a cuff repair or any type of shoulder surgery.    Pt states she began having L shoulder pain about 1 year ago when she slept  [place]rummy[place] the [place].  Pt continued to have pain.  Pt began working out at [employer] in January.  Pt states she didn't have increased pain with gym exercises except with the shoulder press machine and chest press machine.  She stopped using the shoulder press machine and only uses the R UE with chest press machine.  Pt began having biceps pain after workouts and stopped performing elbow flexion exercises.  Her pain worsened in March and she went to see ortho MD.  Pt states her shoulder feels better since not performing elbow flexion exercises.  Pt has received 2 cortisone shots and received greater pain relief with the 2nd injection.   Pt is very limited with lifting.  She has pain with lifting grocery bags, a gallon of milk, and her grandchild.  She has to modify how she picks up her grandchild.  Pt is limited with reaching behind back and has to move her arm slow with abduction.  Pt has soreness with grabbing seatbelt and driving.  She has soreness with pulling off sports bra.  Pt is unable to sleep on L side.    Hand dominance: Right  PERTINENT HISTORY: MRI findings of posterosuperior labral tear Osteoporosis which has improved to osteopenia  Migraines due to environmental/bariatric pressures changes per pt  PAIN:  Pt states she is having anterior shoulder and biceps pain.  Pt also having cervical and UT pain.  Pt states her neck will bother her more than her shoulder at times.  Pt feels spasms in her neck.    NPRS:  1-2/10 current at rest, 7/10 worst, 0/10 best Location: anterior, lateral, and posterior shoulder  PRECAUTIONS: Other: per MRI findings of posterosuperior labral tear, osteopenia   WEIGHT BEARING RESTRICTIONS: No  FALLS:  Has patient fallen in last 6 months? No  LIVING ENVIRONMENT: Lives with: lives with their spouse   OCCUPATION: Pt is a retired Pharmacist, community.  PLOF: Independent.  Pt has chronic shoulder pain.   PATIENT GOALS:  to be pain free, full function, full  ROM   OBJECTIVE:   DIAGNOSTIC FINDINGS:  MRI per (MD note): -Prior cuff repair.  Moderate distal supraspinatus and infraspinatus tendinosis with bursal side fraying.  No high grade or retracted cuff tear -mild subacromial subdeltoid bursitis -Mild GH and AC joint OA -posterosuperior labral tear through the labral base -mild intra-articular long head biceps tendinosis  PATIENT SURVEYS:  FOTO 63 with a goal of 68 at visit 10  COGNITION: Overall cognitive status: Within functional limits for tasks assessed      UPPER EXTREMITY ROM:   AROM/PROM Right eval Left eval  Shoulder flexion 180 165  Shoulder scaption 178 158 with pain  Shoulder abduction 168 154 with pain  Shoulder adduction    Shoulder internal rotation 72 66  Shoulder external rotation 90 63/68  Elbow flexion    Elbow extension    Wrist flexion    Wrist extension    Wrist ulnar deviation    Wrist radial deviation    Wrist pronation    Wrist supination    (Blank rows = not tested)  UPPER EXTREMITY MMT:  MMT Right eval Left eval  Shoulder flexion 5/5 4+/5  Shoulder scaption 5/5 4/5  Shoulder abduction 5/5 weak  Shoulder adduction    Shoulder internal rotation 5/5 5/5  Shoulder external rotation 5/5 4+/5  Middle trapezius    Lower trapezius    Elbow flexion 5/5 4+/5  Elbow extension    Wrist flexion    Wrist extension    Wrist ulnar deviation    Wrist radial deviation    Wrist pronation    Wrist supination    Grip strength (lbs)    (Blank rows = not tested)  SHOULDER SPECIAL TESTS: Impingement tests: Neer's Impingement test:  R:  negative, L: positive Rotator cuff assessment: ER lag test:  R:  negative, L:  pt able to hold without difficulty though does have pain     PALPATION:  TTP:  L anterior and  posterior shoulder, UT   TODAY'S TREATMENT:                                                                                                                                          09/03/23 TTP L UT  Manual: STM to L pre and post dry needling for trigger point identification and muscular relaxation. Trigger Point Dry-Needling  Treatment instructions: Expect mild to moderate muscle soreness. S/S of pneumothorax if dry needled over a lung field, and to seek immediate medical attention should they occur. Patient verbalized understanding of these instructions and education.  Patient Consent Given: Yes Education handout provided: Yes Muscles treated: L UT Electrical stimulation performed: No Parameters: N/A Treatment response/outcome: decrease in tissue tension  Bilateral shoulder ER GTB 2 x 10  Horizontal abduction GTB 2 x 10 Standing shoulder PNF D2 GTB 2 x 10   EVAL: Pt has been performing T band ER and IR and T band shoulder extension.  Pt performed shoulder ABC x 1 rep.  Pt received a HEP handout and was educated in correct form and appropriate frequency.    PATIENT EDUCATION: Education details: dx, relevant anatomy, POC, objective findings, HEP, and rationale of exercises.  PT answered pt's questions.  Person educated: Patient Education method: Explanation, Demonstration, Verbal cues, and Handouts Education comprehension: verbalized understanding, returned demonstration, verbal cues required, and needs further education  HOME EXERCISE PROGRAM: Access Code: [email or web address] URL: [email or web address] Date: 08/27/2023 Prepared by: Aaron Edelman  Exercises - Supine Shoulder Alphabet  - 1 x daily - 7 x weekly - 1 reps   09/03/23 - Shoulder External Rotation and Scapular Retraction with Resistance  - 1 x daily - 7 x weekly - 3 sets - 10 reps - Standing Shoulder Horizontal Abduction with Resistance  - 1 x daily - 7 x weekly - 3 sets - 10 reps - Standing Shoulder Single Arm PNF D2 Flexion with Resistance (Mirrored)  - 1 x daily - 7 x weekly - 3 sets - 10 reps ASSESSMENT:  CLINICAL IMPRESSION: Patient with hyperactive and tender UT. Completed DN  with decrease in tissue tension. Additional shoulder/periscap strengthening tolerated well.  Patient will continue to benefit from physical therapy in order to improve function and reduce impairment.    EVAL: Patient is a 67 y.o. female with a dx of L shoulder cuff tendonosis and tightness presenting to the clinic with L shoulder pain, muscle weakness in L UE, and limited ROM in L shoulder.  Pt has had L shoulder pain for 1 year and had an exacerbation in March.  Pt reports having improved pain since stopping elbow flexion exercises in the gym.  Pt is very limited with lifting and has pain with lifting grocery bags and her grandchild.  Pt is limited with reaching behind back and has soreness with grabbing seatbelt and driving.  She is limited with gym exercises.  Pt should  benefit from skilled PT services to address impairments and to improve overall function.   OBJECTIVE IMPAIRMENTS: decreased ROM, decreased strength, hypomobility, impaired flexibility, impaired UE functional use, and pain.   ACTIVITY LIMITATIONS: carrying, lifting, sleeping, dressing, and caring for others  PARTICIPATION LIMITATIONS: driving  PERSONAL FACTORS: 1 comorbidity: osteopenia  are also affecting patient's functional outcome.   REHAB POTENTIAL: Good  CLINICAL DECISION MAKING: Stable/uncomplicated  EVALUATION COMPLEXITY: Low   GOALS:   SHORT TERM GOALS: Target date: 09/16/2023   Pt will be independent and compliant with HEP for improved pain, strength, ROM, and function.  Baseline: Goal status: INITIAL  2.  Pt will report at least a 25% improvement in pain and sx's overall. Baseline:  Goal status: INITIAL    LONG TERM GOALS: Target date: 10/07/2023   Pt will be able to lift grocery bags without significant pain and difficulty.  Baseline:  Goal status: INITIAL  2.  Pt will demo improved L shoulder flexion, ER, and scaption strength to 5/5 MMT for improved tolerance with and performance of  functional lifting and carrying.   Baseline:  Goal status: INITIAL  3.  Pt will be able to drive without increased pain.   Baseline:  Goal status: INITIAL  4.  Pt will report improved ease and reduced pain with her normal functional reaching activities.  Baseline:  Goal status: INITIAL    PLAN:  PT FREQUENCY: 2x/wk  PT DURATION: 6 weeks  PLANNED INTERVENTIONS: Therapeutic exercises, Therapeutic activity, Neuromuscular re-education, Patient/Family education, Self Care, Aquatic Therapy, Dry Needling, Electrical stimulation, Cryotherapy, Moist heat, Taping, Ultrasound, Manual therapy, and Re-evaluation  PLAN FOR NEXT SESSION: Cont with shoulder and scapular strengthening and stabilization with precautions concerning posterosuperior labral tear.  Perform supine rhythmic stab's next visit.    Referring diagnosis? Left shoulder cuff tendonosis stiffness  Treatment diagnosis? (if different than referring diagnosis)  Left shoulder pain, unspecified chronicity  Muscle weakness (generalized)  Stiffness of left shoulder, not elsewhere classified  What was this (referring dx) caused by? []  Surgery []  Fall []  Ongoing issue []  Arthritis []  Other: ____________  Laterality: []  Rt [x]  Lt []  Both  Check all possible CPT codes:  *CHOOSE 10 OR LESS*    [x]  97110 (Therapeutic Exercise)  []  92507 (SLP Treatment)  [x]  16109 (Neuro Re-ed)   []  92526 (Swallowing Treatment)   []  97116 (Gait Training)   []  60454 (Cognitive Training, 1st 15 minutes) [x]  97140 (Manual Therapy)   []  97130 (Cognitive Training, each add'l 15 minutes)  [x]  97164 (Re-evaluation)                              []  Other, List CPT Code ____________  [x]  97530 (Therapeutic Activities)     [x]  97535 (Self Care)   []  All codes above (97110 - 97535)  []  97012 (Mechanical Traction)  [x]  97014 (E-stim Unattended)  [x]  97032 (E-stim manual)  []  97033 (Ionto)  [x]  97035 (Ultrasound) [x]  97750 (Physical Performance  Training) [x]  [id number] (Aquatic Therapy) []  97016 (Vasopneumatic Device) []  [id number] (Paraffin) []  97034 (Contrast Bath) []  97597 (Wound Care 1st 20 sq cm) []  97598 (Wound Care each add'l 20 sq cm) []  97760 (Orthotic Fabrication, Fitting, Training Initial) []  [id number] (Prosthetic Management and Training Initial) []  [id number] (Orthotic or Prosthetic Training/ Modification Subsequent)   Reola Mosher Lakisa Lotz, PT 09/03/2023, 4:03 PM

## 2023-09-04 DIAGNOSIS — M67912 Unspecified disorder of synovium and tendon, left shoulder: Secondary | ICD-10-CM | POA: Diagnosis not present

## 2023-09-22 ENCOUNTER — Encounter (HOSPITAL_BASED_OUTPATIENT_CLINIC_OR_DEPARTMENT_OTHER): Payer: Self-pay | Admitting: Physical Therapy

## 2023-09-22 ENCOUNTER — Ambulatory Visit (HOSPITAL_BASED_OUTPATIENT_CLINIC_OR_DEPARTMENT_OTHER): Payer: Medicare PPO | Admitting: Physical Therapy

## 2023-09-22 DIAGNOSIS — M25612 Stiffness of left shoulder, not elsewhere classified: Secondary | ICD-10-CM

## 2023-09-22 DIAGNOSIS — M25512 Pain in left shoulder: Secondary | ICD-10-CM

## 2023-09-22 DIAGNOSIS — M6281 Muscle weakness (generalized): Secondary | ICD-10-CM | POA: Diagnosis not present

## 2023-09-22 NOTE — Therapy (Signed)
OUTPATIENT PHYSICAL THERAPY TREATMENT   Patient Name: Sandra Mora MRN: [id number] DOB:23-Mar-1956, 67 y.o., female Today's Date: 09/22/2023  END OF SESSION:  PT End of Session - 09/22/23 0845     Visit Number 3    Number of Visits 12    Date for PT Re-Evaluation 10/07/23    Authorization Type [employer]    Authorization Time Period 12 visits approved 10/3-11/30    PT Start Time 0847    PT Stop Time 0927    PT Time Calculation (min) 40 min    Activity Tolerance Patient tolerated treatment well    Behavior During Therapy [facility] for tasks assessed/performed             Past Medical History:  Diagnosis Date   Allergy    seasonal   Migraine    Osteoporosis    Spine -3 (2013)   Past Surgical History:  Procedure Laterality Date   ABDOMINAL HYSTERECTOMY     COSMETIC SURGERY     Patient Active Problem List   Diagnosis Date Noted   Anxiety 06/28/2022   Family history of embolic stroke 06/28/2022   PDA (patent ductus arteriosus) 06/28/2022   Gastroesophageal reflux disease 01/15/2021   Encounter for counseling 01/15/2021   Osteoporosis    Allergic rhinitis 08/06/2014   Physical exam, annual 07/27/2012   Migraine      REFERRING PROVIDER: Jones Broom, MD  REFERRING DIAG: Left shoulder cuff tendonosis stiffness  THERAPY DIAG:  Left shoulder pain, unspecified chronicity  Muscle weakness (generalized)  Stiffness of left shoulder, not elsewhere classified  Rationale for Evaluation and Treatment: Rehabilitation  ONSET DATE: Exacerbation in March 2024  SUBJECTIVE:                                                                                                                                                                                      SUBJECTIVE STATEMENT: Patient states DN really helped her neck. UT is so much better some pulling at the origin. Started having increased neck pain with band exercises. Did a lot of walking on her trip. Can feel labrum with  moving shoulder and some soreness in neck. Has not had to take advil recently.   EVAL: Pt states she has not had a cuff repair or any type of shoulder surgery.    Pt states she began having L shoulder pain about 1 year ago when she slept on a "crummy" matress at the beach.  Pt continued to have pain.  Pt began working out at [employer] in January.  Pt states she didn't have increased pain with gym exercises except with the shoulder press machine and chest press machine.  She stopped using the shoulder press machine and only uses the R UE with chest press machine.  Pt began having biceps pain after workouts and stopped performing elbow flexion exercises.  Her pain worsened in March and she went to see ortho MD.  Pt states her shoulder feels better since not performing elbow flexion exercises.  Pt has received 2 cortisone shots and received greater pain relief with the 2nd injection.   Pt is very limited with lifting.  She has pain with lifting grocery bags, a gallon of milk, and her grandchild.  She has to modify how she picks up her grandchild.  Pt is limited with reaching behind back and has to move her arm slow with abduction.  Pt has soreness with grabbing seatbelt and driving.  She has soreness with pulling off sports bra.  Pt is unable to sleep on L side.    Hand dominance: Right  PERTINENT HISTORY: MRI findings of posterosuperior labral tear Osteoporosis which has improved to osteopenia  Migraines due to environmental/bariatric pressures changes per pt  PAIN:  Pt states she is having anterior shoulder and biceps pain.  Pt also having cervical and UT pain.  Pt states her neck will bother her more than her shoulder at times.  Pt feels spasms in her neck.    NPRS:  1/10 current at rest, 7/10 worst, 0/10 best Location: anterior, lateral, and posterior shoulder  PRECAUTIONS: Other: per MRI findings of posterosuperior labral tear, osteopenia   WEIGHT BEARING RESTRICTIONS: No  FALLS:  Has  patient fallen in last 6 months? No  LIVING ENVIRONMENT: Lives with: lives with their spouse   OCCUPATION: Pt is a retired Pharmacist, community.  PLOF: Independent.  Pt has chronic shoulder pain.   PATIENT GOALS:  to be pain free, full function, full ROM   OBJECTIVE:   DIAGNOSTIC FINDINGS:  MRI per (MD note): -Prior cuff repair.  Moderate distal supraspinatus and infraspinatus tendinosis with bursal side fraying.  No high grade or retracted cuff tear -mild subacromial subdeltoid bursitis -Mild GH and AC joint OA -posterosuperior labral tear through the labral base -mild intra-articular long head biceps tendinosis  PATIENT SURVEYS:  FOTO 63 with a goal of 68 at visit 10  COGNITION: Overall cognitive status: Within functional limits for tasks assessed      UPPER EXTREMITY ROM:   AROM/PROM Right eval Left eval  Shoulder flexion 180 165  Shoulder scaption 178 158 with pain  Shoulder abduction 168 154 with pain  Shoulder adduction    Shoulder internal rotation 72 66  Shoulder external rotation 90 63/68  Elbow flexion    Elbow extension    Wrist flexion    Wrist extension    Wrist ulnar deviation    Wrist radial deviation    Wrist pronation    Wrist supination    (Blank rows = not tested)  UPPER EXTREMITY MMT:  MMT Right eval Left eval  Shoulder flexion 5/5 4+/5  Shoulder scaption 5/5 4/5  Shoulder abduction 5/5 weak  Shoulder adduction    Shoulder internal rotation 5/5 5/5  Shoulder external rotation 5/5 4+/5  Middle trapezius    Lower trapezius    Elbow flexion 5/5 4+/5  Elbow extension    Wrist flexion    Wrist extension    Wrist ulnar deviation    Wrist radial deviation    Wrist pronation    Wrist supination    Grip strength (lbs)    (Blank rows = not tested)  SHOULDER SPECIAL  TESTS: Impingement tests: Neer's Impingement test:  R:  negative, L: positive Rotator cuff assessment: ER lag test:  R:  negative, L:  pt able to hold without difficulty though does  have pain     PALPATION:  TTP:  L anterior and posterior shoulder, UT   TODAY'S TREATMENT:                                                                                                                                         09/22/23 Bilateral shoulder ER GTB 2 x 10  Horizontal abduction GTB 2 x 10 Supine shoulder PNF D2 GTB 2 x 10 Supine rhythmic stab 4 x 30 second bouts Prone shoulder extension 2# 2 x 10  Prone row 2# 2 x 10 Prone shoulder horizontal abduction 2 x 10  Seated shoulder flexion isometrics 10 x 5-10 second holds Standing row GTB 2 x 10 Standing shoulder extension GTB 2 x 10  09/03/23 TTP L UT  Manual: STM to L pre and post dry needling for trigger point identification and muscular relaxation. Trigger Point Dry-Needling  Treatment instructions: Expect mild to moderate muscle soreness. S/S of pneumothorax if dry needled over a lung field, and to seek immediate medical attention should they occur. Patient verbalized understanding of these instructions and education.  Patient Consent Given: Yes Education handout provided: Yes Muscles treated: L UT Electrical stimulation performed: No Parameters: N/A Treatment response/outcome: decrease in tissue tension  Bilateral shoulder ER GTB 2 x 10  Horizontal abduction GTB 2 x 10 Standing shoulder PNF D2 GTB 2 x 10   EVAL: Pt has been performing T band ER and IR and T band shoulder extension.  Pt performed shoulder ABC x 1 rep.  Pt received a HEP handout and was educated in correct form and appropriate frequency.    PATIENT EDUCATION: Education details: dx, relevant anatomy, POC, objective findings, HEP, and rationale of exercises.  PT answered pt's questions. 09/22/23 HEP Person educated: Patient Education method: Programmer, multimedia, Demonstration, Verbal cues, and Handouts Education comprehension: verbalized understanding, returned demonstration, verbal cues required, and needs further education  HOME EXERCISE  PROGRAM: Access Code: [email or web address] URL: [email or web address] Date: 08/27/2023 Prepared by: Aaron Edelman  Exercises - Supine Shoulder Alphabet  - 1 x daily - 7 x weekly - 1 reps   09/03/23 - Shoulder External Rotation and Scapular Retraction with Resistance  - 1 x daily - 7 x weekly - 3 sets - 10 reps - Standing Shoulder Horizontal Abduction with Resistance  - 1 x daily - 7 x weekly - 3 sets - 10 reps - Standing Shoulder Single Arm PNF D2 Flexion with Resistance (Mirrored)  - 1 x daily - 7 x weekly - 3 sets - 10 reps  09/22/23 - Prone Shoulder Extension - Single Arm (Mirrored)  - 1 x daily - 7 x weekly - 2 sets - 10 reps - Prone Single Arm  Shoulder Horizontal Abduction with Scapular Retraction and Palm Down  - 1 x daily - 7 x weekly - 2 sets - 10 reps - Prone Shoulder Row  - 1 x daily - 7 x weekly - 2 sets - 10 reps - Seated Isometric Elbow Flexion  - 1 x daily - 7 x weekly - 10 reps - 10 second hold - Standing Shoulder Row with Anchored Resistance  - 1 x daily - 7 x weekly - 2 sets - 10 reps - Shoulder extension with resistance - Neutral  - 1 x daily - 7 x weekly - 2 sets - 10 reps  ASSESSMENT:  CLINICAL IMPRESSION: Patient with some shoulder pain initially with band series which improves with cueing for reduced tension. Good mechanics with previously performed exercises. Began additional shoulder and periscap strengthening with prone and band exercises with intermittent c/o anterior shoulder symptoms at biceps tendon that ease with reps and stops following exercises. Patient will continue to benefit from physical therapy in order to improve function and reduce impairment.    EVAL: Patient is a 67 y.o. female with a dx of L shoulder cuff tendonosis and tightness presenting to the clinic with L shoulder pain, muscle weakness in L UE, and limited ROM in L shoulder.  Pt has had L shoulder pain for 1 year and had an exacerbation in March.  Pt reports having improved pain since  stopping elbow flexion exercises in the gym.  Pt is very limited with lifting and has pain with lifting grocery bags and her grandchild.  Pt is limited with reaching behind back and has soreness with grabbing seatbelt and driving.  She is limited with gym exercises.  Pt should benefit from skilled PT services to address impairments and to improve overall function.   OBJECTIVE IMPAIRMENTS: decreased ROM, decreased strength, hypomobility, impaired flexibility, impaired UE functional use, and pain.   ACTIVITY LIMITATIONS: carrying, lifting, sleeping, dressing, and caring for others  PARTICIPATION LIMITATIONS: driving  PERSONAL FACTORS: 1 comorbidity: osteopenia  are also affecting patient's functional outcome.   REHAB POTENTIAL: Good  CLINICAL DECISION MAKING: Stable/uncomplicated  EVALUATION COMPLEXITY: Low   GOALS:   SHORT TERM GOALS: Target date: 09/16/2023   Pt will be independent and compliant with HEP for improved pain, strength, ROM, and function.  Baseline: Goal status: INITIAL  2.  Pt will report at least a 25% improvement in pain and sx's overall. Baseline:  Goal status: INITIAL    LONG TERM GOALS: Target date: 10/07/2023   Pt will be able to lift grocery bags without significant pain and difficulty.  Baseline:  Goal status: INITIAL  2.  Pt will demo improved L shoulder flexion, ER, and scaption strength to 5/5 MMT for improved tolerance with and performance of functional lifting and carrying.   Baseline:  Goal status: INITIAL  3.  Pt will be able to drive without increased pain.   Baseline:  Goal status: INITIAL  4.  Pt will report improved ease and reduced pain with her normal functional reaching activities.  Baseline:  Goal status: INITIAL    PLAN:  PT FREQUENCY: 2x/wk  PT DURATION: 6 weeks  PLANNED INTERVENTIONS: Therapeutic exercises, Therapeutic activity, Neuromuscular re-education, Patient/Family education, Self Care, Aquatic Therapy, Dry  Needling, Electrical stimulation, Cryotherapy, Moist heat, Taping, Ultrasound, Manual therapy, and Re-evaluation  PLAN FOR NEXT SESSION: Cont with shoulder and scapular strengthening and stabilization with precautions concerning posterosuperior labral tear.  Perform supine rhythmic stab's next visit.    Referring diagnosis? Left  shoulder cuff tendonosis stiffness  Treatment diagnosis? (if different than referring diagnosis)  Left shoulder pain, unspecified chronicity  Muscle weakness (generalized)  Stiffness of left shoulder, not elsewhere classified  What was this (referring dx) caused by? []  Surgery []  Fall []  Ongoing issue []  Arthritis []  Other: ____________  Laterality: []  Rt [x]  Lt []  Both  Check all possible CPT codes:  *CHOOSE 10 OR LESS*    [x]  97110 (Therapeutic Exercise)  []  92507 (SLP Treatment)  [x]  97112 (Neuro Re-ed)   []  92526 (Swallowing Treatment)   []  97116 (Gait Training)   []  [id number] (Cognitive Training, 1st 15 minutes) [x]  97140 (Manual Therapy)   []  97130 (Cognitive Training, each add'l 15 minutes)  [x]  97164 (Re-evaluation)                              []  Other, List CPT Code ____________  [x]  97530 (Therapeutic Activities)     [x]  97535 (Self Care)   []  All codes above (97110 - 97535)  []  97012 (Mechanical Traction)  [x]  97014 (E-stim Unattended)  [x]  97032 (E-stim manual)  []  97033 (Ionto)  [x]  97035 (Ultrasound) [x]  97750 (Physical Performance Training) [x]  40981 (Aquatic Therapy) []  97016 (Vasopneumatic Device) []  [id number] (Paraffin) []  97034 (Contrast Bath) []  97597 (Wound Care 1st 20 sq cm) []  97598 (Wound Care each add'l 20 sq cm) []  97760 (Orthotic Fabrication, Fitting, Training Initial) []  [id number] (Prosthetic Management and Training Initial) []  [id number] (Orthotic or Prosthetic Training/ Modification Subsequent)   Reola Mosher Kalene Cutler, PT 09/22/2023, 9:28 AM

## 2023-09-23 DIAGNOSIS — M9902 Segmental and somatic dysfunction of thoracic region: Secondary | ICD-10-CM | POA: Diagnosis not present

## 2023-09-23 DIAGNOSIS — M5414 Radiculopathy, thoracic region: Secondary | ICD-10-CM | POA: Diagnosis not present

## 2023-09-24 ENCOUNTER — Encounter (HOSPITAL_BASED_OUTPATIENT_CLINIC_OR_DEPARTMENT_OTHER): Payer: Self-pay | Admitting: Physical Therapy

## 2023-09-24 ENCOUNTER — Telehealth: Payer: Self-pay

## 2023-09-24 ENCOUNTER — Ambulatory Visit (HOSPITAL_BASED_OUTPATIENT_CLINIC_OR_DEPARTMENT_OTHER): Payer: Medicare PPO | Attending: Orthopedic Surgery | Admitting: Physical Therapy

## 2023-09-24 DIAGNOSIS — M6281 Muscle weakness (generalized): Secondary | ICD-10-CM | POA: Insufficient documentation

## 2023-09-24 DIAGNOSIS — M25512 Pain in left shoulder: Secondary | ICD-10-CM | POA: Insufficient documentation

## 2023-09-24 DIAGNOSIS — M25612 Stiffness of left shoulder, not elsewhere classified: Secondary | ICD-10-CM | POA: Diagnosis not present

## 2023-09-24 NOTE — Telephone Encounter (Signed)
[employer] approval extension   Approval letter will be uploaded to media tab.

## 2023-09-24 NOTE — Therapy (Signed)
OUTPATIENT PHYSICAL THERAPY TREATMENT   Patient Name: Sandra Mora MRN: [id number] DOB:Jan 15, 1956, 67 y.o., female Today's Date: 09/24/2023  END OF SESSION:  PT End of Session - 09/24/23 0857     Visit Number 4    Number of Visits 12    Date for PT Re-Evaluation 10/07/23    Authorization Type [employer]    PT Start Time 0854    PT Stop Time 0933    PT Time Calculation (min) 39 min    Activity Tolerance Patient tolerated treatment well    Behavior During Therapy [facility] for tasks assessed/performed             Past Medical History:  Diagnosis Date   Allergy    seasonal   Migraine    Osteoporosis    Spine -3 (2013)   Past Surgical History:  Procedure Laterality Date   ABDOMINAL HYSTERECTOMY     COSMETIC SURGERY     Patient Active Problem List   Diagnosis Date Noted   Anxiety 06/28/2022   Family history of embolic stroke 06/28/2022   PDA (patent ductus arteriosus) 06/28/2022   Gastroesophageal reflux disease 01/15/2021   Encounter for counseling 01/15/2021   Osteoporosis    Allergic rhinitis 08/06/2014   Physical exam, annual 07/27/2012   Migraine      REFERRING PROVIDER: Jones Broom, MD  REFERRING DIAG: Left shoulder cuff tendonosis stiffness  THERAPY DIAG:  Left shoulder pain, unspecified chronicity  Muscle weakness (generalized)  Stiffness of left shoulder, not elsewhere classified  Rationale for Evaluation and Treatment: Rehabilitation  ONSET DATE: Exacerbation in March 2024  SUBJECTIVE:                                                                                                                                                                                      SUBJECTIVE STATEMENT: Patient states the DN really helped.  Pt didn't perform the DB exercises at home due to some pain in neck and soreness in shoulder.  She did perform the theraband exercises at home.    Hand dominance: Right  PERTINENT HISTORY: MRI findings of posterosuperior  labral tear Osteoporosis which has improved to osteopenia  Migraines due to environmental/bariatric pressures changes per pt  PAIN:  Pt states she is having anterior shoulder and biceps pain.  Pt also having cervical and UT pain.  Pt states her neck will bother her more than her shoulder at times.  Pt feels spasms in her neck.    NPRS:  2/10 current at rest, 7/10 worst, 0/10 best Location:  L UT  PRECAUTIONS: Other: per MRI findings of posterosuperior labral tear, osteopenia   WEIGHT BEARING RESTRICTIONS: No  FALLS:  Has patient fallen in last 6 months? No  LIVING ENVIRONMENT: Lives with: lives with their spouse   OCCUPATION: Pt is a retired Pharmacist, community.  PLOF: Independent.  Pt has chronic shoulder pain.   PATIENT GOALS:  to be pain free, full function, full ROM   OBJECTIVE:   DIAGNOSTIC FINDINGS:  MRI per (MD note): -Prior cuff repair.  Moderate distal supraspinatus and infraspinatus tendinosis with bursal side fraying.  No high grade or retracted cuff tear -mild subacromial subdeltoid bursitis -Mild GH and AC joint OA -posterosuperior labral tear through the labral base -mild intra-articular long head biceps tendinosis     TODAY'S TREATMENT:                                                                                                                                          09/24/2023 Manual Therapy:  STM to L UT seated to improve  pain and soft tissue tightness/mobility and reduce myofascial adhesions and restrictions   Therapeutic Exercise: Reviewed response to prior Rx, pain levels, and HEP compliance. Bilateral shoulder ER GTB 2 x 10  Horizontal abduction GTB 2 x 10 Standing row GTB 2 x 10 Standing shoulder extension RTB 2 x 10  Neuro Re-ed activities: Supine rhythmic stab's at 90 deg and 60 deg flexion 2x30 sec each Supine PNF rhythmic stab's 2x10 Supine shoulder ABC x 1 rep with 1#    PATIENT EDUCATION: Education details: dx, relevant anatomy, POC,  exercise form, HEP, and rationale of exercises.   Person educated: Patient Education method: Explanation, Demonstration, Verbal cues Education comprehension: verbalized understanding, returned demonstration, verbal cues required, and needs further education  HOME EXERCISE PROGRAM: Access Code: [email or web address] URL: [email or web address] Date: 08/27/2023 Prepared by: Aaron Edelman  Exercises - Supine Shoulder Alphabet  - 1 x daily - 7 x weekly - 1 reps   09/03/23 - Shoulder External Rotation and Scapular Retraction with Resistance  - 1 x daily - 7 x weekly - 3 sets - 10 reps - Standing Shoulder Horizontal Abduction with Resistance  - 1 x daily - 7 x weekly - 3 sets - 10 reps - Standing Shoulder Single Arm PNF D2 Flexion with Resistance (Mirrored)  - 1 x daily - 7 x weekly - 3 sets - 10 reps  09/22/23 - Prone Shoulder Extension - Single Arm (Mirrored)  - 1 x daily - 7 x weekly - 2 sets - 10 reps - Prone Single Arm Shoulder Horizontal Abduction with Scapular Retraction and Palm Down  - 1 x daily - 7 x weekly - 2 sets - 10 reps - Prone Shoulder Row  - 1 x daily - 7 x weekly - 2 sets - 10 reps - Seated Isometric Elbow Flexion  - 1 x daily - 7 x weekly - 10 reps - 10 second hold - Standing Shoulder Row with Anchored Resistance  - 1 x daily -  7 x weekly - 2 sets - 10 reps - Shoulder extension with resistance - Neutral  - 1 x daily - 7 x weekly - 2 sets - 10 reps  ASSESSMENT:  CLINICAL IMPRESSION: Pt has soft tissue tightness with a trigger point in L UT.  PT performed STM to L UT and pt responded well reporting improved sx's after STM.  Pt performed exercises well with cuing and instruction in correct form.  Pt reports L biceps discomfort with shoulder extension with GTB.  PT decreased intensity to RTB and pt reports feeling much better.  Pt responded well to Rx stating she felt better after Rx.  She should benefit from cont skilled PT services to address impairments and goals and to  improve overall function.     OBJECTIVE IMPAIRMENTS: decreased ROM, decreased strength, hypomobility, impaired flexibility, impaired UE functional use, and pain.   ACTIVITY LIMITATIONS: carrying, lifting, sleeping, dressing, and caring for others  PARTICIPATION LIMITATIONS: driving  PERSONAL FACTORS: 1 comorbidity: osteopenia  are also affecting patient's functional outcome.   REHAB POTENTIAL: Good  CLINICAL DECISION MAKING: Stable/uncomplicated  EVALUATION COMPLEXITY: Low   GOALS:   SHORT TERM GOALS: Target date: 09/16/2023   Pt will be independent and compliant with HEP for improved pain, strength, ROM, and function.  Baseline: Goal status: INITIAL  2.  Pt will report at least a 25% improvement in pain and sx's overall. Baseline:  Goal status: INITIAL    LONG TERM GOALS: Target date: 10/07/2023   Pt will be able to lift grocery bags without significant pain and difficulty.  Baseline:  Goal status: INITIAL  2.  Pt will demo improved L shoulder flexion, ER, and scaption strength to 5/5 MMT for improved tolerance with and performance of functional lifting and carrying.   Baseline:  Goal status: INITIAL  3.  Pt will be able to drive without increased pain.   Baseline:  Goal status: INITIAL  4.  Pt will report improved ease and reduced pain with her normal functional reaching activities.  Baseline:  Goal status: INITIAL    PLAN:  PT FREQUENCY: 2x/wk  PT DURATION: 6 weeks  PLANNED INTERVENTIONS: Therapeutic exercises, Therapeutic activity, Neuromuscular re-education, Patient/Family education, Self Care, Aquatic Therapy, Dry Needling, Electrical stimulation, Cryotherapy, Moist heat, Taping, Ultrasound, Manual therapy, and Re-evaluation  PLAN FOR NEXT SESSION: Cont with shoulder and scapular strengthening and stabilization with precautions concerning posterosuperior labral tear.    Audie Clear III PT, DPT 09/24/23 1:33 PM

## 2023-09-29 ENCOUNTER — Ambulatory Visit (HOSPITAL_BASED_OUTPATIENT_CLINIC_OR_DEPARTMENT_OTHER): Payer: Medicare PPO | Admitting: Physical Therapy

## 2023-09-29 DIAGNOSIS — M25512 Pain in left shoulder: Secondary | ICD-10-CM | POA: Diagnosis not present

## 2023-09-29 DIAGNOSIS — M6281 Muscle weakness (generalized): Secondary | ICD-10-CM

## 2023-09-29 DIAGNOSIS — M25612 Stiffness of left shoulder, not elsewhere classified: Secondary | ICD-10-CM | POA: Diagnosis not present

## 2023-09-29 NOTE — Therapy (Signed)
OUTPATIENT PHYSICAL THERAPY TREATMENT   Patient Name: Sandra Mora MRN: [id number] DOB:1956/08/19, 67 y.o., female Today's Date: 09/30/2023  END OF SESSION:  PT End of Session - 09/29/23 1119     Visit Number 5    Number of Visits 12    Date for PT Re-Evaluation 10/07/23    Authorization Type [employer]    PT Start Time 1114   pt arrived 14 mins late to treatment   PT Stop Time 1146    PT Time Calculation (min) 32 min    Activity Tolerance Patient tolerated treatment well    Behavior During Therapy [facility] for tasks assessed/performed              Past Medical History:  Diagnosis Date   Allergy    seasonal   Migraine    Osteoporosis    Spine -3 (2013)   Past Surgical History:  Procedure Laterality Date   ABDOMINAL HYSTERECTOMY     COSMETIC SURGERY     Patient Active Problem List   Diagnosis Date Noted   Anxiety 06/28/2022   Family history of embolic stroke 06/28/2022   PDA (patent ductus arteriosus) 06/28/2022   Gastroesophageal reflux disease 01/15/2021   Encounter for counseling 01/15/2021   Osteoporosis    Allergic rhinitis 08/06/2014   Physical exam, annual 07/27/2012   Migraine      REFERRING PROVIDER: Jones Broom, MD  REFERRING DIAG: Left shoulder cuff tendonosis stiffness  THERAPY DIAG:  Left shoulder pain, unspecified chronicity  Muscle weakness (generalized)  Stiffness of left shoulder, not elsewhere classified  Rationale for Evaluation and Treatment: Rehabilitation  ONSET DATE: Exacerbation in March 2024  SUBJECTIVE:                                                                                                                                                                                      SUBJECTIVE STATEMENT: Patient states she has a little irritation in the shoulder with all of the exercises though not bad.  Pt denies any adverse effects after prior Rx.  Pt worked out in [employer] on Monday.  Pt performed the life fitness seated row  and triceps extension machines.  Pt had no adverse effects after those machines.  Pt still has some tightness/ache in L UT though it doesn't have spasms like it did earlier.  Pt reports compliance with HEP.       Hand dominance: Right  PERTINENT HISTORY: MRI findings of posterosuperior labral tear Osteoporosis which has improved to osteopenia  Migraines due to environmental/bariatric pressures changes per pt  PAIN:  Pt states she is having anterior shoulder and biceps pain.  Pt also having  cervical and UT pain.  Pt states her neck will bother her more than her shoulder at times.  Pt feels spasms in her neck.    NPRS:  3/10 current at rest, 7/10 worst, 0/10 best Location:  L UT  PRECAUTIONS: Other: per MRI findings of posterosuperior labral tear, osteopenia   WEIGHT BEARING RESTRICTIONS: No  FALLS:  Has patient fallen in last 6 months? No  LIVING ENVIRONMENT: Lives with: lives with their spouse   OCCUPATION: Pt is a retired Pharmacist, community.  PLOF: Independent.  Pt has chronic shoulder pain.   PATIENT GOALS:  to be pain free, full function, full ROM   OBJECTIVE:   DIAGNOSTIC FINDINGS:  MRI per (MD note): -Prior cuff repair.  Moderate distal supraspinatus and infraspinatus tendinosis with bursal side fraying.  No high grade or retracted cuff tear -mild subacromial subdeltoid bursitis -Mild GH and AC joint OA -posterosuperior labral tear through the labral base -mild intra-articular long head biceps tendinosis     TODAY'S TREATMENT:                                                                                                                                          09/24/2023 Manual Therapy:  STM to L UT seated to improve  pain and soft tissue tightness/mobility and reduce myofascial adhesions and restrictions   Therapeutic Exercise: Reviewed response to prior Rx, pain levels, and HEP compliance. Bilateral shoulder ER GTB 2 x 10  Standing row GTB 2 x 10   Neuro Re-ed  activities: Supine rhythmic stab's at 60/90/120 deg 2x30 sec each Supine PNF rhythmic stab's x10 reps    PATIENT EDUCATION: Education details: dx, relevant anatomy, POC, exercise form, HEP, and rationale of exercises.   Person educated: Patient Education method: Explanation, Demonstration, Verbal cues Education comprehension: verbalized understanding, returned demonstration, verbal cues required, and needs further education  HOME EXERCISE PROGRAM: Access Code: [email or web address] URL: [email or web address] Date: 08/27/2023 Prepared by: Aaron Edelman  Exercises - Supine Shoulder Alphabet  - 1 x daily - 7 x weekly - 1 reps   09/03/23 - Shoulder External Rotation and Scapular Retraction with Resistance  - 1 x daily - 7 x weekly - 3 sets - 10 reps - Standing Shoulder Horizontal Abduction with Resistance  - 1 x daily - 7 x weekly - 3 sets - 10 reps - Standing Shoulder Single Arm PNF D2 Flexion with Resistance (Mirrored)  - 1 x daily - 7 x weekly - 3 sets - 10 reps  09/22/23 - Prone Shoulder Extension - Single Arm (Mirrored)  - 1 x daily - 7 x weekly - 2 sets - 10 reps - Prone Single Arm Shoulder Horizontal Abduction with Scapular Retraction and Palm Down  - 1 x daily - 7 x weekly - 2 sets - 10 reps - Prone Shoulder Row  - 1 x daily - 7 x weekly - 2 sets -  10 reps - Seated Isometric Elbow Flexion  - 1 x daily - 7 x weekly - 10 reps - 10 second hold - Standing Shoulder Row with Anchored Resistance  - 1 x daily - 7 x weekly - 2 sets - 10 reps - Shoulder extension with resistance - Neutral  - 1 x daily - 7 x weekly - 2 sets - 10 reps  ASSESSMENT:  CLINICAL IMPRESSION: Pt has soft tissue tightness with a trigger point in L UT.  PT performed STM to L UT and pt felt better after STM.  Pt performed exercises well with cuing and instruction in correct form.  Rx time was limited today due to pt arriving late.  She responded well to Rx stating her neck felt good and she had a little fatigue  and soreness in her shoulder.  She should benefit from cont skilled PT services to address impairments and goals and to improve overall function.     OBJECTIVE IMPAIRMENTS: decreased ROM, decreased strength, hypomobility, impaired flexibility, impaired UE functional use, and pain.   ACTIVITY LIMITATIONS: carrying, lifting, sleeping, dressing, and caring for others  PARTICIPATION LIMITATIONS: driving  PERSONAL FACTORS: 1 comorbidity: osteopenia  are also affecting patient's functional outcome.   REHAB POTENTIAL: Good  CLINICAL DECISION MAKING: Stable/uncomplicated  EVALUATION COMPLEXITY: Low   GOALS:   SHORT TERM GOALS: Target date: 09/16/2023   Pt will be independent and compliant with HEP for improved pain, strength, ROM, and function.  Baseline: Goal status: INITIAL  2.  Pt will report at least a 25% improvement in pain and sx's overall. Baseline:  Goal status: INITIAL    LONG TERM GOALS: Target date: 10/07/2023   Pt will be able to lift grocery bags without significant pain and difficulty.  Baseline:  Goal status: INITIAL  2.  Pt will demo improved L shoulder flexion, ER, and scaption strength to 5/5 MMT for improved tolerance with and performance of functional lifting and carrying.   Baseline:  Goal status: INITIAL  3.  Pt will be able to drive without increased pain.   Baseline:  Goal status: INITIAL  4.  Pt will report improved ease and reduced pain with her normal functional reaching activities.  Baseline:  Goal status: INITIAL    PLAN:  PT FREQUENCY: 2x/wk  PT DURATION: 6 weeks  PLANNED INTERVENTIONS: Therapeutic exercises, Therapeutic activity, Neuromuscular re-education, Patient/Family education, Self Care, Aquatic Therapy, Dry Needling, Electrical stimulation, Cryotherapy, Moist heat, Taping, Ultrasound, Manual therapy, and Re-evaluation  PLAN FOR NEXT SESSION: Cont with shoulder and scapular strengthening and stabilization with precautions  concerning posterosuperior labral tear.    Audie Clear III PT, DPT 09/30/23 3:35 PM

## 2023-09-30 ENCOUNTER — Encounter (HOSPITAL_BASED_OUTPATIENT_CLINIC_OR_DEPARTMENT_OTHER): Payer: Self-pay | Admitting: Physical Therapy

## 2023-10-01 ENCOUNTER — Encounter (HOSPITAL_BASED_OUTPATIENT_CLINIC_OR_DEPARTMENT_OTHER): Payer: Self-pay | Admitting: Physical Therapy

## 2023-10-01 ENCOUNTER — Ambulatory Visit (HOSPITAL_BASED_OUTPATIENT_CLINIC_OR_DEPARTMENT_OTHER): Payer: Medicare PPO | Admitting: Physical Therapy

## 2023-10-01 DIAGNOSIS — M25612 Stiffness of left shoulder, not elsewhere classified: Secondary | ICD-10-CM | POA: Diagnosis not present

## 2023-10-01 DIAGNOSIS — M6281 Muscle weakness (generalized): Secondary | ICD-10-CM | POA: Diagnosis not present

## 2023-10-01 DIAGNOSIS — M25512 Pain in left shoulder: Secondary | ICD-10-CM | POA: Diagnosis not present

## 2023-10-01 NOTE — Therapy (Signed)
OUTPATIENT PHYSICAL THERAPY TREATMENT   Patient Name: Sandra Mora MRN: [id number] DOB:January 12, 1956, 67 y.o., female Today's Date: 10/01/2023  END OF SESSION:  PT End of Session - 10/01/23 1147     Visit Number 6    Number of Visits 12    Date for PT Re-Evaluation 10/07/23    Authorization Type [employer]    PT Start Time 1104    PT Stop Time 1149    PT Time Calculation (min) 45 min    Activity Tolerance Patient tolerated treatment well    Behavior During Therapy [facility] for tasks assessed/performed               Past Medical History:  Diagnosis Date   Allergy    seasonal   Migraine    Osteoporosis    Spine -3 (2013)   Past Surgical History:  Procedure Laterality Date   ABDOMINAL HYSTERECTOMY     COSMETIC SURGERY     Patient Active Problem List   Diagnosis Date Noted   Anxiety 06/28/2022   Family history of embolic stroke 06/28/2022   PDA (patent ductus arteriosus) 06/28/2022   Gastroesophageal reflux disease 01/15/2021   Encounter for counseling 01/15/2021   Osteoporosis    Allergic rhinitis 08/06/2014   Physical exam, annual 07/27/2012   Migraine      REFERRING PROVIDER: Jones Broom, MD  REFERRING DIAG: Left shoulder cuff tendonosis stiffness  THERAPY DIAG:  Left shoulder pain, unspecified chronicity  Muscle weakness (generalized)  Stiffness of left shoulder, not elsewhere classified  Rationale for Evaluation and Treatment: Rehabilitation  ONSET DATE: Exacerbation in March 2024  SUBJECTIVE:                                                                                                                                                                                      SUBJECTIVE STATEMENT: Patient had no adverse effects after prior Rx.  Pt went to the gym yesterday and performed the life fitness row, back extension, and triceps machines.  She went home and performed her entire HEP.  Pt reports having soreness in posterior shoulder and increased L  sided cervical pain.  Pt reports improved stability and strength.  Pt feels like her rhomboids are waking up.   Hand dominance: Right  PERTINENT HISTORY: MRI findings of posterosuperior labral tear Osteoporosis which has improved to osteopenia  Migraines due to environmental/bariatric pressures changes per pt  PAIN:  Pt states she is having anterior shoulder and biceps pain.  Pt also having cervical and UT pain.  Pt states her neck will bother her more than her shoulder at times.  Pt feels spasms in her neck.  NPRS:  1/10 /  2/10 current at rest, 7/10 worst, 0/10 best Location:  L shoulder / L UT  PRECAUTIONS: Other: per MRI findings of posterosuperior labral tear, osteopenia   WEIGHT BEARING RESTRICTIONS: No  FALLS:  Has patient fallen in last 6 months? No  LIVING ENVIRONMENT: Lives with: lives with their spouse   OCCUPATION: Pt is a retired Pharmacist, community.  PLOF: Independent.  Pt has chronic shoulder pain.   PATIENT GOALS:  to be pain free, full function, full ROM   OBJECTIVE:   DIAGNOSTIC FINDINGS:  MRI per (MD note): -Prior cuff repair.  Moderate distal supraspinatus and infraspinatus tendinosis with bursal side fraying.  No high grade or retracted cuff tear -mild subacromial subdeltoid bursitis -Mild GH and AC joint OA -posterosuperior labral tear through the labral base -mild intra-articular long head biceps tendinosis     TODAY'S TREATMENT:                                                                                                                                           Manual Therapy:  STM to L UT seated to improve pain and soft tissue tightness/mobility and reduce myofascial adhesions and restrictions. PT demonstrated and educated pt using a theracane.  Pt used theracane on UT.  Therapeutic Exercise: Bilateral shoulder ER GTB 2 x 10  Standing row GTB 2 x 10 Shoulder extension with RTB 2x10 Shoulder horizontal abduction with GTB 2x10   Neuro Re-ed  activities: Supine rhythmic stab's at 60/90/120 deg 2x30 sec each Supine PNF rhythmic stab's 2x10 reps Supine shoulder ABC x 1 reps with 2#    PATIENT EDUCATION: Education details: dx, relevant anatomy, POC, exercise form, HEP, and rationale of exercises.   Person educated: Patient Education method: Explanation, Demonstration, Verbal cues Education comprehension: verbalized understanding, returned demonstration, verbal cues required, and needs further education  HOME EXERCISE PROGRAM: Access Code: [email or web address] URL: [email or web address] Date: 08/27/2023 Prepared by: Aaron Edelman  Exercises - Supine Shoulder Alphabet  - 1 x daily - 7 x weekly - 1 reps   09/03/23 - Shoulder External Rotation and Scapular Retraction with Resistance  - 1 x daily - 7 x weekly - 3 sets - 10 reps - Standing Shoulder Horizontal Abduction with Resistance  - 1 x daily - 7 x weekly - 3 sets - 10 reps - Standing Shoulder Single Arm PNF D2 Flexion with Resistance (Mirrored)  - 1 x daily - 7 x weekly - 3 sets - 10 reps  09/22/23 - Prone Shoulder Extension - Single Arm (Mirrored)  - 1 x daily - 7 x weekly - 2 sets - 10 reps - Prone Single Arm Shoulder Horizontal Abduction with Scapular Retraction and Palm Down  - 1 x daily - 7 x weekly - 2 sets - 10 reps - Prone Shoulder Row  - 1 x daily - 7 x weekly - 2 sets -  10 reps - Seated Isometric Elbow Flexion  - 1 x daily - 7 x weekly - 10 reps - 10 second hold - Standing Shoulder Row with Anchored Resistance  - 1 x daily - 7 x weekly - 2 sets - 10 reps - Shoulder extension with resistance - Neutral  - 1 x daily - 7 x weekly - 2 sets - 10 reps  ASSESSMENT:  CLINICAL IMPRESSION: Pt presents to Rx reporting improved strength and stability.  She has trigger points in L UT.  PT performed STM to L UT and pt felt better after STM.  Pt has a theracane at home.  PT demonstrated and instructed pt in using the theracane to improve UT tightness.  Pt performed  exercises well with cuing and instruction in correct form.  She responded well to Rx reporting minimally increased pain in shoulder to 2/10 and minimally decreased pain in UT to 1/10.  She should benefit from cont skilled PT services to address impairments and goals and to improve overall function.    OBJECTIVE IMPAIRMENTS: decreased ROM, decreased strength, hypomobility, impaired flexibility, impaired UE functional use, and pain.   ACTIVITY LIMITATIONS: carrying, lifting, sleeping, dressing, and caring for others  PARTICIPATION LIMITATIONS: driving  PERSONAL FACTORS: 1 comorbidity: osteopenia  are also affecting patient's functional outcome.   REHAB POTENTIAL: Good  CLINICAL DECISION MAKING: Stable/uncomplicated  EVALUATION COMPLEXITY: Low   GOALS:   SHORT TERM GOALS: Target date: 09/16/2023   Pt will be independent and compliant with HEP for improved pain, strength, ROM, and function.  Baseline: Goal status: INITIAL  2.  Pt will report at least a 25% improvement in pain and sx's overall. Baseline:  Goal status: INITIAL    LONG TERM GOALS: Target date: 10/07/2023   Pt will be able to lift grocery bags without significant pain and difficulty.  Baseline:  Goal status: INITIAL  2.  Pt will demo improved L shoulder flexion, ER, and scaption strength to 5/5 MMT for improved tolerance with and performance of functional lifting and carrying.   Baseline:  Goal status: INITIAL  3.  Pt will be able to drive without increased pain.   Baseline:  Goal status: INITIAL  4.  Pt will report improved ease and reduced pain with her normal functional reaching activities.  Baseline:  Goal status: INITIAL    PLAN:  PT FREQUENCY: 2x/wk  PT DURATION: 6 weeks  PLANNED INTERVENTIONS: Therapeutic exercises, Therapeutic activity, Neuromuscular re-education, Patient/Family education, Self Care, Aquatic Therapy, Dry Needling, Electrical stimulation, Cryotherapy, Moist heat, Taping,  Ultrasound, Manual therapy, and Re-evaluation  PLAN FOR NEXT SESSION: Cont with shoulder and scapular strengthening and stabilization with precautions concerning posterosuperior labral tear.    Audie Clear III PT, DPT 10/01/23 9:04 PM

## 2023-10-06 ENCOUNTER — Encounter (HOSPITAL_BASED_OUTPATIENT_CLINIC_OR_DEPARTMENT_OTHER): Payer: Self-pay | Admitting: Physical Therapy

## 2023-10-06 ENCOUNTER — Ambulatory Visit (HOSPITAL_BASED_OUTPATIENT_CLINIC_OR_DEPARTMENT_OTHER): Payer: Medicare PPO | Admitting: Physical Therapy

## 2023-10-06 DIAGNOSIS — M25512 Pain in left shoulder: Secondary | ICD-10-CM | POA: Diagnosis not present

## 2023-10-06 DIAGNOSIS — M25612 Stiffness of left shoulder, not elsewhere classified: Secondary | ICD-10-CM | POA: Diagnosis not present

## 2023-10-06 DIAGNOSIS — M9902 Segmental and somatic dysfunction of thoracic region: Secondary | ICD-10-CM | POA: Diagnosis not present

## 2023-10-06 DIAGNOSIS — M6281 Muscle weakness (generalized): Secondary | ICD-10-CM | POA: Diagnosis not present

## 2023-10-06 DIAGNOSIS — M5414 Radiculopathy, thoracic region: Secondary | ICD-10-CM | POA: Diagnosis not present

## 2023-10-06 NOTE — Therapy (Signed)
OUTPATIENT PHYSICAL THERAPY TREATMENT   Patient Name: Sandra Mora MRN: [id number] DOB:1956/05/31, 67 y.o., female Today's Date: 10/07/2023  END OF SESSION:  PT End of Session - 10/06/23 1107     Visit Number 7    Number of Visits 12    Date for PT Re-Evaluation 10/07/23    Authorization Type [employer]    PT Start Time 1102    PT Stop Time 1147    PT Time Calculation (min) 45 min    Activity Tolerance Patient tolerated treatment well    Behavior During Therapy [facility] for tasks assessed/performed                Past Medical History:  Diagnosis Date   Allergy    seasonal   Migraine    Osteoporosis    Spine -3 (2013)   Past Surgical History:  Procedure Laterality Date   ABDOMINAL HYSTERECTOMY     COSMETIC SURGERY     Patient Active Problem List   Diagnosis Date Noted   Anxiety 06/28/2022   Family history of embolic stroke 06/28/2022   PDA (patent ductus arteriosus) 06/28/2022   Gastroesophageal reflux disease 01/15/2021   Encounter for counseling 01/15/2021   Osteoporosis    Allergic rhinitis 08/06/2014   Physical exam, annual 07/27/2012   Migraine      REFERRING PROVIDER: Jones Broom, MD  REFERRING DIAG: Left shoulder cuff tendonosis stiffness  THERAPY DIAG:  Left shoulder pain, unspecified chronicity  Muscle weakness (generalized)  Stiffness of left shoulder, not elsewhere classified  Rationale for Evaluation and Treatment: Rehabilitation  ONSET DATE: Exacerbation in March 2024  SUBJECTIVE:                                                                                                                                                                                      SUBJECTIVE STATEMENT: Patient had no adverse effects after prior Rx.  Pt has been performing her HEP.  Pt did a lot of sitting yesterday and noticed her pain in sitting.  "Neck always seem to override shoulder."  Pt used her theracane on Friday and Saturday.  Hand dominance:  Right  PERTINENT HISTORY: MRI findings of posterosuperior labral tear Osteoporosis which has improved to osteopenia  Migraines due to environmental/bariatric pressures changes per pt  PAIN:  NPRS:  1/10 /  2.5/10 current at rest, 7/10 worst, 0/10 best Location:  L shoulder / L UT  PRECAUTIONS: Other: per MRI findings of posterosuperior labral tear, osteopenia   WEIGHT BEARING RESTRICTIONS: No  FALLS:  Has patient fallen in last 6 months? No  LIVING ENVIRONMENT: Lives with: lives with their spouse   OCCUPATION: Pt  is a retired Pharmacist, community.  PLOF: Independent.  Pt has chronic shoulder pain.   PATIENT GOALS:  to be pain free, full function, full ROM   OBJECTIVE:   DIAGNOSTIC FINDINGS:  MRI per (MD note): -Prior cuff repair.  Moderate distal supraspinatus and infraspinatus tendinosis with bursal side fraying.  No high grade or retracted cuff tear -mild subacromial subdeltoid bursitis -Mild GH and AC joint OA -posterosuperior labral tear through the labral base -mild intra-articular long head biceps tendinosis     TODAY'S TREATMENT:                                                                                                                                           Manual Therapy:  STM to L UT seated to improve pain and soft tissue tightness/mobility and reduce myofascial adhesions and restrictions.  PT performed static cupping to L UT.  Therapeutic Exercise: Prone row 2x10 with 2# Prone extension to neutral 2x10 with 2# Prone horizontal abd 2x10 Standing row GTB 2 x 10 Shoulder extension with GTB 2x10    Neuro Re-ed activities: Supine rhythmic stab's at 60/90/120 deg 2x30 sec each Supine PNF rhythmic stab's 2x10 reps Supine shoulder ABC x 1 reps with 2#    PATIENT EDUCATION: Education details: dx, relevant anatomy, POC, exercise form, HEP, and rationale of exercises.   Person educated: Patient Education method: Explanation, Demonstration, Verbal  cues Education comprehension: verbalized understanding, returned demonstration, verbal cues required, and needs further education  HOME EXERCISE PROGRAM: Access Code: [email or web address] URL: [email or web address] Date: 08/27/2023 Prepared by: Aaron Edelman  Exercises - Supine Shoulder Alphabet  - 1 x daily - 7 x weekly - 1 reps   09/03/23 - Shoulder External Rotation and Scapular Retraction with Resistance  - 1 x daily - 7 x weekly - 3 sets - 10 reps - Standing Shoulder Horizontal Abduction with Resistance  - 1 x daily - 7 x weekly - 3 sets - 10 reps - Standing Shoulder Single Arm PNF D2 Flexion with Resistance (Mirrored)  - 1 x daily - 7 x weekly - 3 sets - 10 reps  09/22/23 - Prone Shoulder Extension - Single Arm (Mirrored)  - 1 x daily - 7 x weekly - 2 sets - 10 reps - Prone Single Arm Shoulder Horizontal Abduction with Scapular Retraction and Palm Down  - 1 x daily - 7 x weekly - 2 sets - 10 reps - Prone Shoulder Row  - 1 x daily - 7 x weekly - 2 sets - 10 reps - Seated Isometric Elbow Flexion  - 1 x daily - 7 x weekly - 10 reps - 10 second hold - Standing Shoulder Row with Anchored Resistance  - 1 x daily - 7 x weekly - 2 sets - 10 reps - Shoulder extension with resistance - Neutral  - 1 x daily - 7 x weekly - 2 sets -  10 reps  ASSESSMENT:  CLINICAL IMPRESSION: Pt continues to have tightness in L UT.  PT performed cupping today to improve UT tightness and soft tissue mobility and to reduce myofascial adhesions/restrictions.  Pt performed exercises well with cuing and instruction in correct form.  She did require some cuing to decrease shoulder shrug with standing theraband rows.  Pt has good tolerance with exercises.  She responded well to Rx reporting decreased UT pain and increased shoulder pain after Rx.  She should benefit from cont skilled PT services to address impairments and goals and to improve overall function.   OBJECTIVE IMPAIRMENTS: decreased ROM, decreased  strength, hypomobility, impaired flexibility, impaired UE functional use, and pain.   ACTIVITY LIMITATIONS: carrying, lifting, sleeping, dressing, and caring for others  PARTICIPATION LIMITATIONS: driving  PERSONAL FACTORS: 1 comorbidity: osteopenia  are also affecting patient's functional outcome.   REHAB POTENTIAL: Good  CLINICAL DECISION MAKING: Stable/uncomplicated  EVALUATION COMPLEXITY: Low   GOALS:   SHORT TERM GOALS: Target date: 09/16/2023   Pt will be independent and compliant with HEP for improved pain, strength, ROM, and function.  Baseline: Goal status: INITIAL  2.  Pt will report at least a 25% improvement in pain and sx's overall. Baseline:  Goal status: INITIAL    LONG TERM GOALS: Target date: 10/07/2023   Pt will be able to lift grocery bags without significant pain and difficulty.  Baseline:  Goal status: INITIAL  2.  Pt will demo improved L shoulder flexion, ER, and scaption strength to 5/5 MMT for improved tolerance with and performance of functional lifting and carrying.   Baseline:  Goal status: INITIAL  3.  Pt will be able to drive without increased pain.   Baseline:  Goal status: INITIAL  4.  Pt will report improved ease and reduced pain with her normal functional reaching activities.  Baseline:  Goal status: INITIAL    PLAN:  PT FREQUENCY: 2x/wk  PT DURATION: 6 weeks  PLANNED INTERVENTIONS: Therapeutic exercises, Therapeutic activity, Neuromuscular re-education, Patient/Family education, Self Care, Aquatic Therapy, Dry Needling, Electrical stimulation, Cryotherapy, Moist heat, Taping, Ultrasound, Manual therapy, and Re-evaluation  PLAN FOR NEXT SESSION: Cont with shoulder and scapular strengthening and stabilization with precautions concerning posterosuperior labral tear.    Audie Clear III PT, DPT 10/07/23 6:29 PM

## 2023-10-07 DIAGNOSIS — N951 Menopausal and female climacteric states: Secondary | ICD-10-CM | POA: Diagnosis not present

## 2023-10-07 DIAGNOSIS — Z131 Encounter for screening for diabetes mellitus: Secondary | ICD-10-CM | POA: Diagnosis not present

## 2023-10-07 DIAGNOSIS — Z1329 Encounter for screening for other suspected endocrine disorder: Secondary | ICD-10-CM | POA: Diagnosis not present

## 2023-10-07 DIAGNOSIS — Z130 Encounter for screening for diseases of the blood and blood-forming organs and certain disorders involving the immune mechanism: Secondary | ICD-10-CM | POA: Diagnosis not present

## 2023-10-07 DIAGNOSIS — F3341 Major depressive disorder, recurrent, in partial remission: Secondary | ICD-10-CM | POA: Diagnosis not present

## 2023-10-07 DIAGNOSIS — Z6825 Body mass index (BMI) 25.0-25.9, adult: Secondary | ICD-10-CM | POA: Diagnosis not present

## 2023-10-07 DIAGNOSIS — R635 Abnormal weight gain: Secondary | ICD-10-CM | POA: Diagnosis not present

## 2023-10-07 DIAGNOSIS — E7800 Pure hypercholesterolemia, unspecified: Secondary | ICD-10-CM | POA: Diagnosis not present

## 2023-10-07 DIAGNOSIS — M810 Age-related osteoporosis without current pathological fracture: Secondary | ICD-10-CM | POA: Diagnosis not present

## 2023-10-08 ENCOUNTER — Ambulatory Visit (HOSPITAL_BASED_OUTPATIENT_CLINIC_OR_DEPARTMENT_OTHER): Payer: Medicare PPO | Admitting: Physical Therapy

## 2023-10-08 DIAGNOSIS — M6281 Muscle weakness (generalized): Secondary | ICD-10-CM | POA: Diagnosis not present

## 2023-10-08 DIAGNOSIS — M25512 Pain in left shoulder: Secondary | ICD-10-CM | POA: Diagnosis not present

## 2023-10-08 DIAGNOSIS — M25612 Stiffness of left shoulder, not elsewhere classified: Secondary | ICD-10-CM

## 2023-10-08 NOTE — Therapy (Signed)
OUTPATIENT PHYSICAL THERAPY TREATMENT / PROGRESS NOTE  Progress Note Reporting Period 08/26/2023 to 10/08/2023  See note below for Objective Data and Assessment of Progress/Goals.       Patient Name: Sandra Mora MRN: [id number] DOB:1956/09/01, 67 y.o., female Today's Date: 10/09/2023  END OF SESSION:  PT End of Session - 10/08/23 1206     Visit Number 8    Number of Visits 18    Date for PT Re-Evaluation 11/19/23    Authorization Type [employer]    PT Start Time 1105    PT Stop Time 1150    PT Time Calculation (min) 45 min    Activity Tolerance Patient tolerated treatment well    Behavior During Therapy [facility] for tasks assessed/performed                 Past Medical History:  Diagnosis Date   Allergy    seasonal   Migraine    Osteoporosis    Spine -3 (2013)   Past Surgical History:  Procedure Laterality Date   ABDOMINAL HYSTERECTOMY     COSMETIC SURGERY     Patient Active Problem List   Diagnosis Date Noted   Anxiety 06/28/2022   Family history of embolic stroke 06/28/2022   PDA (patent ductus arteriosus) 06/28/2022   Gastroesophageal reflux disease 01/15/2021   Encounter for counseling 01/15/2021   Osteoporosis    Allergic rhinitis 08/06/2014   Physical exam, annual 07/27/2012   Migraine      REFERRING PROVIDER: Jones Broom, MD  REFERRING DIAG: Left shoulder cuff tendonosis stiffness  THERAPY DIAG:  Left shoulder pain, unspecified chronicity  Muscle weakness (generalized)  Stiffness of left shoulder, not elsewhere classified  Rationale for Evaluation and Treatment: Rehabilitation  ONSET DATE: Exacerbation in March 2024  SUBJECTIVE:                                                                                                                                                                                      SUBJECTIVE STATEMENT: Patient states she felt good after prior Rx.  She went to the gym and worked out after prior Rx and had  no adverse effects.  Pt has been performing her HEP.    Pt reports improved strength and stability in shoulder.  Pt reports fewer functional limitations.  She is able to reach for her seatbelt now.  She has improved tolerance with carrying objects.  She is able to lift grocery bags better.  Pt has 20% improvement in reaching behind back.  Pt reports 30-40% improvement in pain and sx's overall.  Pt has to be careful with donning jackets  Hand dominance: Right  PERTINENT  HISTORY: MRI findings of posterosuperior labral tear Osteoporosis which has improved to osteopenia  Migraines due to environmental/bariatric pressures changes per pt  PAIN:  NPRS:  1/10 /  2/10 current at rest, 7/10 worst, 0/10 best Location:  L shoulder / L UT  PRECAUTIONS: Other: per MRI findings of posterosuperior labral tear, osteopenia   WEIGHT BEARING RESTRICTIONS: No  FALLS:  Has patient fallen in last 6 months? No  LIVING ENVIRONMENT: Lives with: lives with their spouse   OCCUPATION: Pt is a retired Pharmacist, community.  PLOF: Independent.  Pt has chronic shoulder pain.   PATIENT GOALS:  to be pain free, full function, full ROM   OBJECTIVE:   DIAGNOSTIC FINDINGS:  MRI per (MD note): -Prior cuff repair.  Moderate distal supraspinatus and infraspinatus tendinosis with bursal side fraying.  No high grade or retracted cuff tear -mild subacromial subdeltoid bursitis -Mild GH and AC joint OA -posterosuperior labral tear through the labral base -mild intra-articular long head biceps tendinosis     TODAY'S TREATMENT:                                                                                                                                          PATIENT SURVEYS:  FOTO 63 with a goal of 68 at visit 10   UPPER EXTREMITY ROM:    AROM/PROM Right eval Left eval Left 11/15  Shoulder flexion 180 165 162  Shoulder scaption 178 158 with pain 158 with pain  Shoulder abduction 168 154 with pain 154 with soreness   Shoulder adduction       Shoulder internal rotation 72 66 68  Shoulder external rotation 90 63/68 70  Elbow flexion       Elbow extension       Wrist flexion       Wrist extension       Wrist ulnar deviation       Wrist radial deviation       Wrist pronation       Wrist supination       (Blank rows = not tested)  UPPER EXTREMITY MMT:   MMT Right eval Left eval Left 11/15  Shoulder flexion 5/5 4+/5 4+/5  Shoulder scaption 5/5 4/5 4/5  Shoulder abduction 5/5 weak 4-/5 through available ROM  Shoulder adduction       Shoulder internal rotation 5/5 5/5   Shoulder external rotation 5/5 4+/5 4+/5  Middle trapezius       Lower trapezius       Elbow flexion 5/5 4+/5 5/5  Elbow extension       Wrist flexion       Wrist extension       Wrist ulnar deviation       Wrist radial deviation       Wrist pronation       Wrist supination       Grip strength (  lbs)       (Blank rows = not tested)  SHOULDER SPECIAL TESTS: Impingement tests: Neer's Impingement test:  R:  negative, L: positive Rotator cuff assessment: ER lag test:  negative bilat.  No pain   Manual Therapy:  STM to L UT seated to improve pain and soft tissue tightness/mobility and reduce myofascial adhesions and restrictions.    Therapeutic Exercise: UBE x 4 mins lvl 1 Prone row 2x10 with 2# Prone extension to neutral 2x10 with 2# Prone horizontal abd 2x10 with 1# S/L ER 2x10 with 0#, 1x10 with 1#  PT assessed shoulder ROM and strength.  PT performed special tests.   FOTO:  Initial/Current: 63 / 65.  Goal of 68.    PATIENT EDUCATION: Education details: dx, relevant anatomy, POC, exercise form, HEP, and rationale of exercises.   Person educated: Patient Education method: Explanation, Demonstration, Verbal cues Education comprehension: verbalized understanding, returned demonstration, verbal cues required, and needs further education  HOME EXERCISE PROGRAM: Access Code: [email or web address] URL:  [email or web address] Date: 08/27/2023 Prepared by: Aaron Edelman  Exercises - Supine Shoulder Alphabet  - 1 x daily - 7 x weekly - 1 reps   09/03/23 - Shoulder External Rotation and Scapular Retraction with Resistance  - 1 x daily - 7 x weekly - 3 sets - 10 reps - Standing Shoulder Horizontal Abduction with Resistance  - 1 x daily - 7 x weekly - 3 sets - 10 reps - Standing Shoulder Single Arm PNF D2 Flexion with Resistance (Mirrored)  - 1 x daily - 7 x weekly - 3 sets - 10 reps  09/22/23 - Prone Shoulder Extension - Single Arm (Mirrored)  - 1 x daily - 7 x weekly - 2 sets - 10 reps - Prone Single Arm Shoulder Horizontal Abduction with Scapular Retraction and Palm Down  - 1 x daily - 7 x weekly - 2 sets - 10 reps - Prone Shoulder Row  - 1 x daily - 7 x weekly - 2 sets - 10 reps - Seated Isometric Elbow Flexion  - 1 x daily - 7 x weekly - 10 reps - 10 second hold - Standing Shoulder Row with Anchored Resistance  - 1 x daily - 7 x weekly - 2 sets - 10 reps - Shoulder extension with resistance - Neutral  - 1 x daily - 7 x weekly - 2 sets - 10 reps  ASSESSMENT:  CLINICAL IMPRESSION: Pt is progressing well in PT as evidenced by subjective reports.  Pt reports 30-40% improvement in pain and sx's overall.  Pt reports improved strength and stability in shoulder and has fewer functional limitations.  She is able to reach for her seatbelt now and is able to lift grocery bags better.  She does continue to have pain including with reaching behind back and having to be careful with donning jackets.  Pt continues to have tightness and mm spasm in  L UT and PT is working on improving soft tissue mobility and decreasing spasm.  Pt had no change in shoulder strength though demonstrates improved biceps strength.  Pt demonstrates improved shoulder ER AROM.  Pt had no pain with ER lag test and continues to have a positive Nee'rs Impingement test.  Pt performed exercises well with cuing and  instruction in correct form.  She has met all STG's and LTG's # 1,3,4.  She responded well to Rx reporting decreased UT pain and increased shoulder pain after Rx.  She should benefit from cont skilled PT services  to address impairments and goals and to improve overall function.     OBJECTIVE IMPAIRMENTS: decreased ROM, decreased strength, hypomobility, impaired flexibility, impaired UE functional use, and pain.   ACTIVITY LIMITATIONS: carrying, lifting, sleeping, dressing, and caring for others  PARTICIPATION LIMITATIONS: driving  PERSONAL FACTORS: 1 comorbidity: osteopenia  are also affecting patient's functional outcome.   REHAB POTENTIAL: Good  CLINICAL DECISION MAKING: Stable/uncomplicated  EVALUATION COMPLEXITY: Low   GOALS:   SHORT TERM GOALS: Target date: 09/16/2023   Pt will be independent and compliant with HEP for improved pain, strength, ROM, and function.  Baseline: Goal status:  GOAL MET  10/08/2023  2.  Pt will report at least a 25% improvement in pain and sx's overall. Baseline:  Goal status:  GOAL MET  10/08/2023    LONG TERM GOALS: Target date: 11/19/2023   Pt will be able to lift grocery bags without significant pain and difficulty.  Baseline:  Goal status: GOAL MET  10/08/23  2.  Pt will demo improved L shoulder flexion, ER, and scaption strength to 5/5 MMT for improved tolerance with and performance of functional lifting and carrying.   Baseline:  Goal status: INITIAL  3.  Pt will be able to drive without increased pain.   Baseline:  Goal status:  GOAL MET  10/08/2023  4.  Pt will report improved ease and reduced pain with her normal functional reaching activities.  Baseline:  Goal status: GOAL MET   10/08/2023  5.  Pt will report at least a 70% improvement in pain and sx's overall.   Goal status:  PROGRESSING 10/08/23  6.  Pt will report her worst pain to be no > than 4/10.   Goal status:  INITIAL    PLAN:  PT FREQUENCY: 2x/wk  PT  DURATION: 4-6 weeks  PLANNED INTERVENTIONS: Therapeutic exercises, Therapeutic activity, Neuromuscular re-education, Patient/Family education, Self Care, Aquatic Therapy, Dry Needling, Electrical stimulation, Cryotherapy, Moist heat, Taping, Ultrasound, Manual therapy, and Re-evaluation  PLAN FOR NEXT SESSION: Cont with shoulder and scapular strengthening and stabilization with precautions concerning posterosuperior labral tear.    Audie Clear III PT, DPT 10/09/23 6:03 PM

## 2023-10-09 ENCOUNTER — Encounter (HOSPITAL_BASED_OUTPATIENT_CLINIC_OR_DEPARTMENT_OTHER): Payer: Self-pay | Admitting: Physical Therapy

## 2023-10-13 ENCOUNTER — Ambulatory Visit (HOSPITAL_BASED_OUTPATIENT_CLINIC_OR_DEPARTMENT_OTHER): Payer: Medicare PPO | Admitting: Physical Therapy

## 2023-10-13 DIAGNOSIS — M25512 Pain in left shoulder: Secondary | ICD-10-CM

## 2023-10-13 DIAGNOSIS — M25612 Stiffness of left shoulder, not elsewhere classified: Secondary | ICD-10-CM | POA: Diagnosis not present

## 2023-10-13 DIAGNOSIS — M6281 Muscle weakness (generalized): Secondary | ICD-10-CM

## 2023-10-13 NOTE — Therapy (Unsigned)
OUTPATIENT PHYSICAL THERAPY TREATMENT / PROGRESS NOTE  Progress Note Reporting Period 08/26/2023 to 10/08/2023  See note below for Objective Data and Assessment of Progress/Goals.       Patient Name: Sandra Mora MRN: [id number] DOB:02-13-1956, 67 y.o., female Today's Date: 10/13/2023  END OF SESSION:        Past Medical History:  Diagnosis Date   Allergy    seasonal   Migraine    Osteoporosis    Spine -3 (2013)   Past Surgical History:  Procedure Laterality Date   ABDOMINAL HYSTERECTOMY     COSMETIC SURGERY     Patient Active Problem List   Diagnosis Date Noted   Anxiety 06/28/2022   Family history of embolic stroke 06/28/2022   PDA (patent ductus arteriosus) 06/28/2022   Gastroesophageal reflux disease 01/15/2021   Encounter for counseling 01/15/2021   Osteoporosis    Allergic rhinitis 08/06/2014   Physical exam, annual 07/27/2012   Migraine      REFERRING PROVIDER: Jones Broom, MD  REFERRING DIAG: Left shoulder cuff tendonosis stiffness  THERAPY DIAG:  No diagnosis found.  Rationale for Evaluation and Treatment: Rehabilitation  ONSET DATE: Exacerbation in March 2024  SUBJECTIVE:                                                                                                                                                                                      SUBJECTIVE STATEMENT: Patient reports improved strength and stability in L shoulder and feels like she is having grinding.  Pt reports reduced shoulder soreness.  Pt denies any adverse effects after prior Rx.  Pt states she ordered a 2# wt and has been doing her home exercises.  Pt reports she started having some vertigo issues yesterday.   states she felt good after prior Rx.  She went to the gym and worked out after prior Rx and had no adverse effects.  Pt has been performing her HEP.    Pt reports improved strength and stability in shoulder.  Pt reports fewer functional limitations.   She is able to reach for her seatbelt now.  She has improved tolerance with carrying objects.  She is able to lift grocery bags better.  Pt has 20% improvement in reaching behind back.  Pt reports 30-40% improvement in pain and sx's overall.  Pt has to be careful with donning jackets  Hand dominance: Right  PERTINENT HISTORY: MRI findings of posterosuperior labral tear Osteoporosis which has improved to osteopenia  Migraines due to environmental/bariatric pressures changes per pt  PAIN:  NPRS:  1-2/10 current, 7/10 worst, 0/10 best Location:  L shoulder and L UT  PRECAUTIONS: Other: per  MRI findings of posterosuperior labral tear, osteopenia   WEIGHT BEARING RESTRICTIONS: No  FALLS:  Has patient fallen in last 6 months? No  LIVING ENVIRONMENT: Lives with: lives with their spouse   OCCUPATION: Pt is a retired Pharmacist, community.  PLOF: Independent.  Pt has chronic shoulder pain.   PATIENT GOALS:  to be pain free, full function, full ROM   OBJECTIVE:   DIAGNOSTIC FINDINGS:  MRI per (MD note): -Prior cuff repair.  Moderate distal supraspinatus and infraspinatus tendinosis with bursal side fraying.  No high grade or retracted cuff tear -mild subacromial subdeltoid bursitis -Mild GH and AC joint OA -posterosuperior labral tear through the labral base -mild intra-articular long head biceps tendinosis     TODAY'S TREATMENT:                                                                                                                                             UPPER EXTREMITY ROM:    AROM/PROM Right eval Left eval Left 11/15  Shoulder flexion 180 165 162  Shoulder scaption 178 158 with pain 158 with pain  Shoulder abduction 168 154 with pain 154 with soreness  Shoulder adduction       Shoulder internal rotation 72 66 68  Shoulder external rotation 90 63/68 70  Elbow flexion       Elbow extension       Wrist flexion       Wrist extension       Wrist ulnar deviation        Wrist radial deviation       Wrist pronation       Wrist supination       (Blank rows = not tested)  UPPER EXTREMITY MMT:   MMT Right eval Left eval Left 11/15  Shoulder flexion 5/5 4+/5 4+/5  Shoulder scaption 5/5 4/5 4/5  Shoulder abduction 5/5 weak 4-/5 through available ROM  Shoulder adduction       Shoulder internal rotation 5/5 5/5   Shoulder external rotation 5/5 4+/5 4+/5  Middle trapezius       Lower trapezius       Elbow flexion 5/5 4+/5 5/5  Elbow extension       Wrist flexion       Wrist extension       Wrist ulnar deviation       Wrist radial deviation       Wrist pronation       Wrist supination       Grip strength (lbs)       (Blank rows = not tested)  SHOULDER SPECIAL TESTS: Impingement tests: Neer's Impingement test:  R:  negative, L: positive Rotator cuff assessment: ER lag test:  negative bilat.  No pain   Manual Therapy:  PT performed static cupping to L UT.  STM to L UT seated to improve pain and  soft tissue tightness/mobility and reduce myofascial adhesions and restrictions.    Therapeutic Exercise: UBE x 4 mins lvl 1 Prone row 2x10 with 2# Prone extension to neutral 2x10 with 2# Prone horizontal abd 2x10 with 1# S/L ER 2x10 with 0#, 1x10 with 1#  PT assessed shoulder ROM and strength.  PT performed special tests.   FOTO:  Initial/Current: 63 / 65.  Goal of 68.    PATIENT EDUCATION: Education details: dx, relevant anatomy, POC, exercise form, HEP, and rationale of exercises.   Person educated: Patient Education method: Explanation, Demonstration, Verbal cues Education comprehension: verbalized understanding, returned demonstration, verbal cues required, and needs further education  HOME EXERCISE PROGRAM: Access Code: [email or web address] URL: [email or web address] Date: 08/27/2023 Prepared by: Aaron Edelman  Exercises - Supine Shoulder Alphabet  - 1 x daily - 7 x weekly - 1 reps   09/03/23 - Shoulder External Rotation and  Scapular Retraction with Resistance  - 1 x daily - 7 x weekly - 3 sets - 10 reps - Standing Shoulder Horizontal Abduction with Resistance  - 1 x daily - 7 x weekly - 3 sets - 10 reps - Standing Shoulder Single Arm PNF D2 Flexion with Resistance (Mirrored)  - 1 x daily - 7 x weekly - 3 sets - 10 reps  09/22/23 - Prone Shoulder Extension - Single Arm (Mirrored)  - 1 x daily - 7 x weekly - 2 sets - 10 reps - Prone Single Arm Shoulder Horizontal Abduction with Scapular Retraction and Palm Down  - 1 x daily - 7 x weekly - 2 sets - 10 reps - Prone Shoulder Row  - 1 x daily - 7 x weekly - 2 sets - 10 reps - Seated Isometric Elbow Flexion  - 1 x daily - 7 x weekly - 10 reps - 10 second hold - Standing Shoulder Row with Anchored Resistance  - 1 x daily - 7 x weekly - 2 sets - 10 reps - Shoulder extension with resistance - Neutral  - 1 x daily - 7 x weekly - 2 sets - 10 reps  ASSESSMENT:  CLINICAL IMPRESSION: Pt is progressing well in PT as evidenced by subjective reports.  Pt reports 30-40% improvement in pain and sx's overall.  Pt reports improved strength and stability in shoulder and has fewer functional limitations.  She is able to reach for her seatbelt now and is able to lift grocery bags better.  She does continue to have pain including with reaching behind back and having to be careful with donning jackets.  Pt continues to have tightness and mm spasm in  L UT and PT is working on improving soft tissue mobility and decreasing spasm.  Pt had no change in shoulder strength though demonstrates improved biceps strength.  Pt demonstrates improved shoulder ER AROM.  Pt had no pain with ER lag test and continues to have a positive Nee'rs Impingement test.  Pt performed exercises well with cuing and instruction in correct form.  She has met all STG's and LTG's # 1,3,4.  She responded well to Rx reporting decreased UT pain and increased shoulder pain after Rx.  She should benefit from cont skilled PT services to  address impairments and goals and to improve overall function.   Pt has a bruise in L UT from prior cupping.  PT avoided the bruised area with cupping.  Pt attempted prone row though had vertigo sx's in prone.  PT withheld prone exercises due to vertigo sx's.  OBJECTIVE IMPAIRMENTS: decreased ROM, decreased strength, hypomobility, impaired flexibility, impaired UE functional use, and pain.   ACTIVITY LIMITATIONS: carrying, lifting, sleeping, dressing, and caring for others  PARTICIPATION LIMITATIONS: driving  PERSONAL FACTORS: 1 comorbidity: osteopenia  are also affecting patient's functional outcome.   REHAB POTENTIAL: Good  CLINICAL DECISION MAKING: Stable/uncomplicated  EVALUATION COMPLEXITY: Low   GOALS:   SHORT TERM GOALS: Target date: 09/16/2023   Pt will be independent and compliant with HEP for improved pain, strength, ROM, and function.  Baseline: Goal status:  GOAL MET  10/08/2023  2.  Pt will report at least a 25% improvement in pain and sx's overall. Baseline:  Goal status:  GOAL MET  10/08/2023    LONG TERM GOALS: Target date: 11/19/2023   Pt will be able to lift grocery bags without significant pain and difficulty.  Baseline:  Goal status: GOAL MET  10/08/23  2.  Pt will demo improved L shoulder flexion, ER, and scaption strength to 5/5 MMT for improved tolerance with and performance of functional lifting and carrying.   Baseline:  Goal status: INITIAL  3.  Pt will be able to drive without increased pain.   Baseline:  Goal status:  GOAL MET  10/08/2023  4.  Pt will report improved ease and reduced pain with her normal functional reaching activities.  Baseline:  Goal status: GOAL MET   10/08/2023  5.  Pt will report at least a 70% improvement in pain and sx's overall.   Goal status:  PROGRESSING 10/08/23  6.  Pt will report her worst pain to be no > than 4/10.   Goal status:  INITIAL    PLAN:  PT FREQUENCY: 2x/wk  PT DURATION: 4-6  weeks  PLANNED INTERVENTIONS: Therapeutic exercises, Therapeutic activity, Neuromuscular re-education, Patient/Family education, Self Care, Aquatic Therapy, Dry Needling, Electrical stimulation, Cryotherapy, Moist heat, Taping, Ultrasound, Manual therapy, and Re-evaluation  PLAN FOR NEXT SESSION: Cont with shoulder and scapular strengthening and stabilization with precautions concerning posterosuperior labral tear.    Audie Clear III PT, DPT 10/13/23 9:44 AM

## 2023-10-14 ENCOUNTER — Encounter (HOSPITAL_BASED_OUTPATIENT_CLINIC_OR_DEPARTMENT_OTHER): Payer: Self-pay | Admitting: Physical Therapy

## 2023-10-14 DIAGNOSIS — F3341 Major depressive disorder, recurrent, in partial remission: Secondary | ICD-10-CM | POA: Diagnosis not present

## 2023-10-14 DIAGNOSIS — E663 Overweight: Secondary | ICD-10-CM | POA: Diagnosis not present

## 2023-10-14 DIAGNOSIS — G43909 Migraine, unspecified, not intractable, without status migrainosus: Secondary | ICD-10-CM | POA: Diagnosis not present

## 2023-10-14 DIAGNOSIS — F419 Anxiety disorder, unspecified: Secondary | ICD-10-CM | POA: Diagnosis not present

## 2023-10-14 DIAGNOSIS — Z1331 Encounter for screening for depression: Secondary | ICD-10-CM | POA: Diagnosis not present

## 2023-10-14 DIAGNOSIS — M810 Age-related osteoporosis without current pathological fracture: Secondary | ICD-10-CM | POA: Diagnosis not present

## 2023-10-14 DIAGNOSIS — N951 Menopausal and female climacteric states: Secondary | ICD-10-CM | POA: Diagnosis not present

## 2023-10-14 DIAGNOSIS — E7800 Pure hypercholesterolemia, unspecified: Secondary | ICD-10-CM | POA: Diagnosis not present

## 2023-10-15 ENCOUNTER — Encounter (HOSPITAL_BASED_OUTPATIENT_CLINIC_OR_DEPARTMENT_OTHER): Payer: Self-pay | Admitting: Physical Therapy

## 2023-10-15 ENCOUNTER — Ambulatory Visit (HOSPITAL_BASED_OUTPATIENT_CLINIC_OR_DEPARTMENT_OTHER): Payer: Medicare PPO | Admitting: Physical Therapy

## 2023-10-15 DIAGNOSIS — M6281 Muscle weakness (generalized): Secondary | ICD-10-CM | POA: Diagnosis not present

## 2023-10-15 DIAGNOSIS — M25612 Stiffness of left shoulder, not elsewhere classified: Secondary | ICD-10-CM | POA: Diagnosis not present

## 2023-10-15 DIAGNOSIS — M25512 Pain in left shoulder: Secondary | ICD-10-CM

## 2023-10-15 NOTE — Therapy (Signed)
OUTPATIENT PHYSICAL THERAPY TREATMENT      Patient Name: Sandra Mora MRN: [id number] DOB:1956/01/24, 67 y.o., female Today's Date: 10/15/2023  END OF SESSION:  PT End of Session - 10/15/23 1121     Visit Number 10    Number of Visits 18    Date for PT Re-Evaluation 11/19/23    Authorization Type [employer]    PT Start Time 1103    PT Stop Time 1147    PT Time Calculation (min) 44 min    Activity Tolerance Patient tolerated treatment well    Behavior During Therapy [facility] for tasks assessed/performed                   Past Medical History:  Diagnosis Date   Allergy    seasonal   Migraine    Osteoporosis    Spine -3 (2013)   Past Surgical History:  Procedure Laterality Date   ABDOMINAL HYSTERECTOMY     COSMETIC SURGERY     Patient Active Problem List   Diagnosis Date Noted   Anxiety 06/28/2022   Family history of embolic stroke 06/28/2022   PDA (patent ductus arteriosus) 06/28/2022   Gastroesophageal reflux disease 01/15/2021   Encounter for counseling 01/15/2021   Osteoporosis    Allergic rhinitis 08/06/2014   Physical exam, annual 07/27/2012   Migraine      REFERRING PROVIDER: Jones Broom, MD  REFERRING DIAG: Left shoulder cuff tendonosis stiffness  THERAPY DIAG:  Left shoulder pain, unspecified chronicity  Muscle weakness (generalized)  Stiffness of left shoulder, not elsewhere classified  Rationale for Evaluation and Treatment: Rehabilitation  ONSET DATE: Exacerbation in March 2024  SUBJECTIVE:                                                                                                                                                                                      SUBJECTIVE STATEMENT: Patient states she did a technique which improved her vertigo.  She is not having any vertigo sx's.  Pt states she felt fine after prior Rx.  Pt is having less grinding and reduced shoulder soreness.     Hand dominance: Right  PERTINENT  HISTORY: MRI findings of posterosuperior labral tear Osteoporosis which has improved to osteopenia  Migraines due to environmental/bariatric pressures changes per pt  PAIN:  NPRS:  1-2/10 current, 7/10 worst, 0/10 best Location:  L shoulder and L UT  PRECAUTIONS: Other: per MRI findings of posterosuperior labral tear, osteopenia   WEIGHT BEARING RESTRICTIONS: No  FALLS:  Has patient fallen in last 6 months? No  LIVING ENVIRONMENT: Lives with: lives with their spouse   OCCUPATION: Pt is a retired Pharmacist, community.  PLOF: Independent.  Pt has chronic shoulder pain.   PATIENT GOALS:  to be pain free, full function, full ROM   OBJECTIVE:   DIAGNOSTIC FINDINGS:  MRI per (MD note): -Prior cuff repair.  Moderate distal supraspinatus and infraspinatus tendinosis with bursal side fraying.  No high grade or retracted cuff tear -mild subacromial subdeltoid bursitis -Mild GH and AC joint OA -posterosuperior labral tear through the labral base -mild intra-articular long head biceps tendinosis     TODAY'S TREATMENT:                                                                                                                                           Manual Therapy:  PT performed static and dynamic cupping to L UT seated to improve pain and soft tissue tightness/mobility and reduce myofascial adhesions and restrictions.    Therapeutic Exercise: UBE x 4 mins lvl 1 Standing cable row 10# 2x10 Shoulder cable shoulder extension 7.5#  2x10 Standing ER with retraction with RTB 2x10 Prone hz abd with 1# 2x10  Neuro Re-ed activities: Standing shoulder ABC x 1 rep  Supine PNF rhythmic stab's 2x10 reps 4D ball rolls 2x10 each cw, ccw, up/down, s/s     PATIENT EDUCATION: Education details: dx, relevant anatomy, POC, exercise form, HEP, and rationale of exercises.   Person educated: Patient Education method: Explanation, Demonstration, Verbal cues Education comprehension: verbalized  understanding, returned demonstration, verbal cues required, and needs further education  HOME EXERCISE PROGRAM: Access Code: [email or web address] URL: [email or web address] Date: 08/27/2023 Prepared by: Aaron Edelman   ASSESSMENT:  CLINICAL IMPRESSION: Pt is progressing well with pain and sx's.  PT progressed exercises with adding standing cable exercises and standing shoulder ABC.  Pt tolerated progression well without c/o's.  She performed ther ex and neuro re-ed activities well with cuing and instruction in correct form.  Pt has improved soft tissue tightness in UT.  PT performed dynamic cupping today and pt had an appropriate response with multiple petichiae.  PT showed response to pt in mirror and instructed her to drink plenty of water.  Pt responded well to Rx having no increased pain and no c/o's after Rx.   She should benefit from cont skilled PT services to address impairments and goals and to improve overall function.    OBJECTIVE IMPAIRMENTS: decreased ROM, decreased strength, hypomobility, impaired flexibility, impaired UE functional use, and pain.   ACTIVITY LIMITATIONS: carrying, lifting, sleeping, dressing, and caring for others  PARTICIPATION LIMITATIONS: driving  PERSONAL FACTORS: 1 comorbidity: osteopenia  are also affecting patient's functional outcome.   REHAB POTENTIAL: Good  CLINICAL DECISION MAKING: Stable/uncomplicated  EVALUATION COMPLEXITY: Low   GOALS:   SHORT TERM GOALS: Target date: 09/16/2023   Pt will be independent and compliant with HEP for improved pain, strength, ROM, and function.  Baseline: Goal status:  GOAL MET  10/08/2023  2.  Pt will report at  least a 25% improvement in pain and sx's overall. Baseline:  Goal status:  GOAL MET  10/08/2023    LONG TERM GOALS: Target date: 11/19/2023   Pt will be able to lift grocery bags without significant pain and difficulty.  Baseline:  Goal status: GOAL MET  10/08/23  2.  Pt will demo  improved L shoulder flexion, ER, and scaption strength to 5/5 MMT for improved tolerance with and performance of functional lifting and carrying.   Baseline:  Goal status: INITIAL  3.  Pt will be able to drive without increased pain.   Baseline:  Goal status:  GOAL MET  10/08/2023  4.  Pt will report improved ease and reduced pain with her normal functional reaching activities.  Baseline:  Goal status: GOAL MET   10/08/2023  5.  Pt will report at least a 70% improvement in pain and sx's overall.   Goal status:  PROGRESSING 10/08/23  6.  Pt will report her worst pain to be no > than 4/10.   Goal status:  INITIAL    PLAN:  PT FREQUENCY: 2x/wk  PT DURATION: 4-6 weeks  PLANNED INTERVENTIONS: Therapeutic exercises, Therapeutic activity, Neuromuscular re-education, Patient/Family education, Self Care, Aquatic Therapy, Dry Needling, Electrical stimulation, Cryotherapy, Moist heat, Taping, Ultrasound, Manual therapy, and Re-evaluation  PLAN FOR NEXT SESSION: Cont with shoulder and scapular strengthening and stabilization with precautions concerning posterosuperior labral tear.  Cont with STW.  Audie Clear III PT, DPT 10/15/23 12:25 PM

## 2023-10-16 ENCOUNTER — Other Ambulatory Visit: Payer: Self-pay | Admitting: Family Medicine

## 2023-10-18 ENCOUNTER — Ambulatory Visit (HOSPITAL_BASED_OUTPATIENT_CLINIC_OR_DEPARTMENT_OTHER): Payer: Medicare PPO | Admitting: Physical Therapy

## 2023-10-18 ENCOUNTER — Encounter (HOSPITAL_BASED_OUTPATIENT_CLINIC_OR_DEPARTMENT_OTHER): Payer: Self-pay | Admitting: Physical Therapy

## 2023-10-18 DIAGNOSIS — M25612 Stiffness of left shoulder, not elsewhere classified: Secondary | ICD-10-CM

## 2023-10-18 DIAGNOSIS — M6281 Muscle weakness (generalized): Secondary | ICD-10-CM

## 2023-10-18 DIAGNOSIS — M25512 Pain in left shoulder: Secondary | ICD-10-CM | POA: Diagnosis not present

## 2023-10-18 NOTE — Therapy (Signed)
OUTPATIENT PHYSICAL THERAPY TREATMENT      Patient Name: Sandra Mora MRN: [id number] DOB:08-26-1956, 67 y.o., female Today's Date: 10/18/2023  END OF SESSION:  PT End of Session - 10/18/23 1200     Visit Number 11    Number of Visits 18    Date for PT Re-Evaluation 11/19/23    Authorization Type [employer]    PT Start Time 1159    PT Stop Time 1241    PT Time Calculation (min) 42 min    Activity Tolerance Patient tolerated treatment well    Behavior During Therapy [facility] for tasks assessed/performed                   Past Medical History:  Diagnosis Date   Allergy    seasonal   Migraine    Osteoporosis    Spine -3 (2013)   Past Surgical History:  Procedure Laterality Date   ABDOMINAL HYSTERECTOMY     COSMETIC SURGERY     Patient Active Problem List   Diagnosis Date Noted   Anxiety 06/28/2022   Family history of embolic stroke 06/28/2022   PDA (patent ductus arteriosus) 06/28/2022   Gastroesophageal reflux disease 01/15/2021   Encounter for counseling 01/15/2021   Osteoporosis    Allergic rhinitis 08/06/2014   Physical exam, annual 07/27/2012   Migraine      REFERRING PROVIDER: Jones Broom, MD  REFERRING DIAG: Left shoulder cuff tendonosis stiffness  THERAPY DIAG:  Left shoulder pain, unspecified chronicity  Muscle weakness (generalized)  Stiffness of left shoulder, not elsewhere classified  Rationale for Evaluation and Treatment: Rehabilitation  ONSET DATE: Exacerbation in March 2024  SUBJECTIVE:                                                                                                                                                                                      SUBJECTIVE STATEMENT: Patient reports improved ROM with reaching to the R side of her body.  She carried a 3# object without thinking about it.  Her biceps sx's have greatly improved.  Pt states she has some soreness today.  Pt denies any adverse effects after prior  Rx.      Hand dominance: Right  PERTINENT HISTORY: MRI findings of posterosuperior labral tear Osteoporosis which has improved to osteopenia  Migraines due to environmental/bariatric pressures changes per pt  PAIN:  NPRS:  1.5/10 current, 7/10 worst, 0/10 best Location:  L shoulder and L UT  PRECAUTIONS: Other: per MRI findings of posterosuperior labral tear, osteopenia   WEIGHT BEARING RESTRICTIONS: No  FALLS:  Has patient fallen in last 6 months? No  LIVING ENVIRONMENT: Lives with: lives  with their spouse   OCCUPATION: Pt is a retired Pharmacist, community.  PLOF: Independent.  Pt has chronic shoulder pain.   PATIENT GOALS:  to be pain free, full function, full ROM   OBJECTIVE:   DIAGNOSTIC FINDINGS:  MRI per (MD note): -Prior cuff repair.  Moderate distal supraspinatus and infraspinatus tendinosis with bursal side fraying.  No high grade or retracted cuff tear -mild subacromial subdeltoid bursitis -Mild GH and AC joint OA -posterosuperior labral tear through the labral base -mild intra-articular long head biceps tendinosis     TODAY'S TREATMENT:                                                                                                                                           Manual Therapy:  STM to L infraspinatus and teres minor seated to improve pain and soft tissue tightness/mobility and reduce myofascial adhesions and restrictions.    Therapeutic Exercise: UBE x 4 mins lvl 1 (2 min fwd/2 min bwd) Standing cable row 10# 2x10 Shoulder cable shoulder extension 7.5#  2x10 Standing ER with retraction with RTB 2x10 Prone hz abd with 1# 2x10  Neuro Re-ed activities: Standing shoulder ABC x 1 rep  Supine PNF rhythmic stab's 2x10 reps 4D ball rolls 2x10 each cw, ccw, up/down, s/s     PATIENT EDUCATION: Education details: dx, relevant anatomy, POC, exercise form, HEP, and rationale of exercises.   Person educated: Patient Education method: Explanation,  Demonstration, Verbal cues Education comprehension: verbalized understanding, returned demonstration, verbal cues required, and needs further education  HOME EXERCISE PROGRAM: Access Code: [email or web address] URL: [email or web address] Date: 08/27/2023 Prepared by: Aaron Edelman   ASSESSMENT:  CLINICAL IMPRESSION: Pt is progressing well with pain and sx's as evidenced by subjective reports.  She reports improved functional ROM  and functional carrying/lifting.  Pt states her biceps sx's have greatly improved also.  Pt performed exercises well and had good tolerance with exercises.  Pt has soft tissue tightness and tenderness with STW to infraspinatus and teres minor.  She states she felt great after STW.  Pt responded well to Rx reporting slightly improved pain to 1/10 after Rx. She should benefit from cont skilled PT services to address impairments and goals and to improve overall function.    OBJECTIVE IMPAIRMENTS: decreased ROM, decreased strength, hypomobility, impaired flexibility, impaired UE functional use, and pain.   ACTIVITY LIMITATIONS: carrying, lifting, sleeping, dressing, and caring for others  PARTICIPATION LIMITATIONS: driving  PERSONAL FACTORS: 1 comorbidity: osteopenia  are also affecting patient's functional outcome.   REHAB POTENTIAL: Good  CLINICAL DECISION MAKING: Stable/uncomplicated  EVALUATION COMPLEXITY: Low   GOALS:   SHORT TERM GOALS: Target date: 09/16/2023   Pt will be independent and compliant with HEP for improved pain, strength, ROM, and function.  Baseline: Goal status:  GOAL MET  10/08/2023  2.  Pt will report at least a 25% improvement in pain  and sx's overall. Baseline:  Goal status:  GOAL MET  10/08/2023    LONG TERM GOALS: Target date: 11/19/2023   Pt will be able to lift grocery bags without significant pain and difficulty.  Baseline:  Goal status: GOAL MET  10/08/23  2.  Pt will demo improved L shoulder flexion, ER, and  scaption strength to 5/5 MMT for improved tolerance with and performance of functional lifting and carrying.   Baseline:  Goal status: INITIAL  3.  Pt will be able to drive without increased pain.   Baseline:  Goal status:  GOAL MET  10/08/2023  4.  Pt will report improved ease and reduced pain with her normal functional reaching activities.  Baseline:  Goal status: GOAL MET   10/08/2023  5.  Pt will report at least a 70% improvement in pain and sx's overall.   Goal status:  PROGRESSING 10/08/23  6.  Pt will report her worst pain to be no > than 4/10.   Goal status:  INITIAL    PLAN:  PT FREQUENCY: 2x/wk  PT DURATION: 4-6 weeks  PLANNED INTERVENTIONS: Therapeutic exercises, Therapeutic activity, Neuromuscular re-education, Patient/Family education, Self Care, Aquatic Therapy, Dry Needling, Electrical stimulation, Cryotherapy, Moist heat, Taping, Ultrasound, Manual therapy, and Re-evaluation  PLAN FOR NEXT SESSION: Cont with shoulder and scapular strengthening and stabilization with precautions concerning posterosuperior labral tear.  Cont with STW.  Audie Clear III PT, DPT 10/18/23 1:19 PM

## 2023-10-20 ENCOUNTER — Encounter (HOSPITAL_BASED_OUTPATIENT_CLINIC_OR_DEPARTMENT_OTHER): Payer: Self-pay | Admitting: Physical Therapy

## 2023-10-20 ENCOUNTER — Ambulatory Visit (HOSPITAL_BASED_OUTPATIENT_CLINIC_OR_DEPARTMENT_OTHER): Payer: Medicare PPO | Admitting: Physical Therapy

## 2023-10-20 DIAGNOSIS — M6281 Muscle weakness (generalized): Secondary | ICD-10-CM | POA: Diagnosis not present

## 2023-10-20 DIAGNOSIS — M25512 Pain in left shoulder: Secondary | ICD-10-CM | POA: Diagnosis not present

## 2023-10-20 DIAGNOSIS — M25612 Stiffness of left shoulder, not elsewhere classified: Secondary | ICD-10-CM

## 2023-10-20 NOTE — Therapy (Signed)
OUTPATIENT PHYSICAL THERAPY TREATMENT      Patient Name: Sandra Mora MRN: [id number] DOB:[birth year], 67 y.o., female Today's Date: 10/20/2023  END OF SESSION:  PT End of Session - 10/20/23 1016     Visit Number 12    Number of Visits 18    Date for PT Re-Evaluation 11/19/23    Authorization Type [employer]    PT Start Time 1016    PT Stop Time 1056    PT Time Calculation (min) 40 min    Activity Tolerance Patient tolerated treatment well    Behavior During Therapy [facility] for tasks assessed/performed                   Past Medical History:  Diagnosis Date   Allergy    seasonal   Migraine    Osteoporosis    Spine -3 (2013)   Past Surgical History:  Procedure Laterality Date   ABDOMINAL HYSTERECTOMY     COSMETIC SURGERY     Patient Active Problem List   Diagnosis Date Noted   Anxiety 06/28/2022   Family history of embolic stroke 06/28/2022   PDA (patent ductus arteriosus) 06/28/2022   Gastroesophageal reflux disease 01/15/2021   Encounter for counseling 01/15/2021   Osteoporosis    Allergic rhinitis 08/06/2014   Physical exam, annual 07/27/2012   Migraine      REFERRING PROVIDER: Jones Broom, MD  REFERRING DIAG: Left shoulder cuff tendonosis stiffness  THERAPY DIAG:  Left shoulder pain, unspecified chronicity  Muscle weakness (generalized)  Stiffness of left shoulder, not elsewhere classified  Rationale for Evaluation and Treatment: Rehabilitation  ONSET DATE: Exacerbation in March 2024  SUBJECTIVE:                                                                                                                                                                                      SUBJECTIVE STATEMENT: Patient reports has been getting better. Improving strength and stability. Some issues with trap sometimes when she compensates. Got a new pillow. Goal is to gain full function of L shoulder and so her UT doesn't tighten up.    Hand dominance:  Right  PERTINENT HISTORY: MRI findings of posterosuperior labral tear Osteoporosis which has improved to osteopenia  Migraines due to environmental/bariatric pressures changes per pt  PAIN:  NPRS:  shoulder 1/10, neck 2/10 current, 7/10 worst, 0/10 best Location:  L shoulder and L UT  PRECAUTIONS: Other: per MRI findings of posterosuperior labral tear, osteopenia   WEIGHT BEARING RESTRICTIONS: No  FALLS:  Has patient fallen in last 6 months? No  LIVING ENVIRONMENT: Lives with: lives with their spouse   OCCUPATION: Pt is  a retired Pharmacist, community.  PLOF: Independent.  Pt has chronic shoulder pain.   PATIENT GOALS:  to be pain free, full function, full ROM   OBJECTIVE:   DIAGNOSTIC FINDINGS:  MRI per (MD note): -Prior cuff repair.  Moderate distal supraspinatus and infraspinatus tendinosis with bursal side fraying.  No high grade or retracted cuff tear -mild subacromial subdeltoid bursitis -Mild GH and AC joint OA -posterosuperior labral tear through the labral base -mild intra-articular long head biceps tendinosis     TODAY'S TREATMENT:                                                                                                                                           10/20/23 Manual: STM to L UT Prone horizontal abduction 1 # 2 x 10 Prone shoulder flexion 2 x 15 Standing shoulder ER at 90 abduction RTB 2 x 10  Standing shoulder IR at 90 abduction RTB 2 x 10  Standing shoulder scaption 1# 2 x 10  10/18/23 Manual Therapy:  STM to L infraspinatus and teres minor seated to improve pain and soft tissue tightness/mobility and reduce myofascial adhesions and restrictions.    Therapeutic Exercise: UBE x 4 mins lvl 1 (2 min fwd/2 min bwd) Standing cable row 10# 2x10 Shoulder cable shoulder extension 7.5#  2x10 Standing ER with retraction with RTB 2x10 Prone hz abd with 1# 2x10  Neuro Re-ed activities: Standing shoulder ABC x 1 rep  Supine PNF rhythmic stab's 2x10  reps 4D ball rolls 2x10 each cw, ccw, up/down, s/s     PATIENT EDUCATION: Education details: dx, relevant anatomy, POC, exercise form, HEP, and rationale of exercises.   Person educated: Patient Education method: Explanation, Demonstration, Verbal cues Education comprehension: verbalized understanding, returned demonstration, verbal cues required, and needs further education  HOME EXERCISE PROGRAM: Access Code: [email or web address] URL: [email or web address] Date: 08/27/2023 Prepared by: Aaron Edelman   ASSESSMENT:  CLINICAL IMPRESSION: Patient with hyperactive and tender UT which improves with manual. Continued with shoulder and periscap strengthening which is tolerated well. Patient will continue to benefit from physical therapy in order to improve function and reduce impairment.    OBJECTIVE IMPAIRMENTS: decreased ROM, decreased strength, hypomobility, impaired flexibility, impaired UE functional use, and pain.   ACTIVITY LIMITATIONS: carrying, lifting, sleeping, dressing, and caring for others  PARTICIPATION LIMITATIONS: driving  PERSONAL FACTORS: 1 comorbidity: osteopenia  are also affecting patient's functional outcome.   REHAB POTENTIAL: Good  CLINICAL DECISION MAKING: Stable/uncomplicated  EVALUATION COMPLEXITY: Low   GOALS:   SHORT TERM GOALS: Target date: 09/16/2023   Pt will be independent and compliant with HEP for improved pain, strength, ROM, and function.  Baseline: Goal status:  GOAL MET  10/08/2023  2.  Pt will report at least a 25% improvement in pain and sx's overall. Baseline:  Goal status:  GOAL MET  10/08/2023    LONG TERM GOALS: Target date:  11/19/2023   Pt will be able to lift grocery bags without significant pain and difficulty.  Baseline:  Goal status: GOAL MET  10/08/23  2.  Pt will demo improved L shoulder flexion, ER, and scaption strength to 5/5 MMT for improved tolerance with and performance of functional lifting and  carrying.   Baseline:  Goal status: INITIAL  3.  Pt will be able to drive without increased pain.   Baseline:  Goal status:  GOAL MET  10/08/2023  4.  Pt will report improved ease and reduced pain with her normal functional reaching activities.  Baseline:  Goal status: GOAL MET   10/08/2023  5.  Pt will report at least a 70% improvement in pain and sx's overall.   Goal status:  PROGRESSING 10/08/23  6.  Pt will report her worst pain to be no > than 4/10.   Goal status:  INITIAL    PLAN:  PT FREQUENCY: 2x/wk  PT DURATION: 4-6 weeks  PLANNED INTERVENTIONS: Therapeutic exercises, Therapeutic activity, Neuromuscular re-education, Patient/Family education, Self Care, Aquatic Therapy, Dry Needling, Electrical stimulation, Cryotherapy, Moist heat, Taping, Ultrasound, Manual therapy, and Re-evaluation  PLAN FOR NEXT SESSION: Cont with shoulder and scapular strengthening and stabilization with precautions concerning posterosuperior labral tear.  Cont with STW.   Reola Mosher Kinlie Janice, PT 10/20/2023, 10:17 AM

## 2023-10-25 DIAGNOSIS — E7800 Pure hypercholesterolemia, unspecified: Secondary | ICD-10-CM | POA: Diagnosis not present

## 2023-10-25 DIAGNOSIS — Z6825 Body mass index (BMI) 25.0-25.9, adult: Secondary | ICD-10-CM | POA: Diagnosis not present

## 2023-11-01 ENCOUNTER — Ambulatory Visit (HOSPITAL_BASED_OUTPATIENT_CLINIC_OR_DEPARTMENT_OTHER): Payer: Medicare PPO | Attending: Orthopedic Surgery | Admitting: Physical Therapy

## 2023-11-01 DIAGNOSIS — M25512 Pain in left shoulder: Secondary | ICD-10-CM | POA: Insufficient documentation

## 2023-11-01 DIAGNOSIS — M6281 Muscle weakness (generalized): Secondary | ICD-10-CM | POA: Diagnosis not present

## 2023-11-01 DIAGNOSIS — M25612 Stiffness of left shoulder, not elsewhere classified: Secondary | ICD-10-CM | POA: Insufficient documentation

## 2023-11-01 NOTE — Therapy (Signed)
OUTPATIENT PHYSICAL THERAPY TREATMENT      Patient Name: Sandra Mora MRN: [id number] DOB:21-Jun-1956, 67 y.o., female Today's Date: 11/01/2023  END OF SESSION:  PT End of Session - 11/01/23 1551     Visit Number 13    PT Start Time 1549                   Past Medical History:  Diagnosis Date   Allergy    seasonal   Migraine    Osteoporosis    Spine -3 (2013)   Past Surgical History:  Procedure Laterality Date   ABDOMINAL HYSTERECTOMY     COSMETIC SURGERY     Patient Active Problem List   Diagnosis Date Noted   Anxiety 06/28/2022   Family history of embolic stroke 06/28/2022   PDA (patent ductus arteriosus) 06/28/2022   Gastroesophageal reflux disease 01/15/2021   Encounter for counseling 01/15/2021   Osteoporosis    Allergic rhinitis 08/06/2014   Physical exam, annual 07/27/2012   Migraine      REFERRING PROVIDER: Jones Broom, MD  REFERRING DIAG: Left shoulder cuff tendonosis stiffness  THERAPY DIAG:  No diagnosis found.  Rationale for Evaluation and Treatment: Rehabilitation  ONSET DATE: Exacerbation in March 2024  SUBJECTIVE:                                                                                                                                                                                      SUBJECTIVE STATEMENT: Patient states she felt fine the following day after prior Rx.  She states she was very sore in the UT and minimal soreness in shoulder 3 days later. She states she typically has soreness 3 days after PT.  Pt states she was able to pick up a bag of groceries without pain which would have hurt in the past.    Hand dominance: Right  PERTINENT HISTORY: MRI findings of posterosuperior labral tear Osteoporosis which has improved to osteopenia  Migraines due to environmental/bariatric pressures changes per pt  PAIN:  NPRS:  shoulder 0/10 at rest and 2/10 with abduction and elbow flexed  NPRS: 1/10 L side of  neck  PRECAUTIONS: Other: per MRI findings of posterosuperior labral tear, osteopenia   WEIGHT BEARING RESTRICTIONS: No  FALLS:  Has patient fallen in last 6 months? No  LIVING ENVIRONMENT: Lives with: lives with their spouse   OCCUPATION: Pt is a retired Pharmacist, community.  PLOF: Independent.  Pt has chronic shoulder pain.   PATIENT GOALS:  to be pain free, full function, full ROM   OBJECTIVE:   DIAGNOSTIC FINDINGS:  MRI per (MD note): -Prior cuff repair.  Moderate distal supraspinatus and  infraspinatus tendinosis with bursal side fraying.  No high grade or retracted cuff tear -mild subacromial subdeltoid bursitis -Mild GH and AC joint OA -posterosuperior labral tear through the labral base -mild intra-articular long head biceps tendinosis     TODAY'S TREATMENT:                                                                                                                                           Manual Therapy:  STM to L infraspinatus and teres minor seated to improve pain and soft tissue tightness/mobility and reduce myofascial adhesions and restrictions.    Therapeutic Exercise: UBE x 4 mins lvl 1 (2 min fwd/2 min bwd) Standing cable row 10# 2x10 Shoulder cable shoulder extension 7.5#  2x10 Prone hz abd with 1# 2x10 Prone extension 2# 2x10  Neuro Re-ed activities: shoulder ABC with p-ball on wall x 1 rep  4D ball rolls 2x10 each cw, ccw, up/down, s/s   10/20/23 Manual: STM to L UT Prone horizontal abduction 1 # 2 x 10 Prone shoulder flexion 2 x 15 Standing shoulder ER at 90 abduction RTB 2 x 10  Standing shoulder IR at 90 abduction RTB 2 x 10  Standing shoulder scaption 1# 2 x 10   10/18/23 Manual Therapy:  STM to L infraspinatus and teres minor seated to improve pain and soft tissue tightness/mobility and reduce myofascial adhesions and restrictions.    Therapeutic Exercise: UBE x 4 mins lvl 1 (2 min fwd/2 min bwd) Standing cable row 10# 2x10 Shoulder cable  shoulder extension 7.5#  2x10 Standing ER with retraction with RTB 2x10 Prone hz abd with 1# 2x10  Neuro Re-ed activities: Standing shoulder ABC x 1 rep  Supine PNF rhythmic stab's 2x10 reps 4D ball rolls 2x10 each cw, ccw, up/down, s/s     PATIENT EDUCATION: Education details: dx, relevant anatomy, POC, exercise form, HEP, and rationale of exercises.   Person educated: Patient Education method: Explanation, Demonstration, Verbal cues Education comprehension: verbalized understanding, returned demonstration, verbal cues required, and needs further education  HOME EXERCISE PROGRAM: Access Code: [email or web address] URL: [email or web address] Date: 08/27/2023 Prepared by: Aaron Edelman   ASSESSMENT:  CLINICAL IMPRESSION: Patient with hyperactive and tender UT which improves with manual. Continued with shoulder and periscap strengthening which is tolerated well. Patient will continue to benefit from physical therapy in order to improve function and reduce impairment.    OBJECTIVE IMPAIRMENTS: decreased ROM, decreased strength, hypomobility, impaired flexibility, impaired UE functional use, and pain.   ACTIVITY LIMITATIONS: carrying, lifting, sleeping, dressing, and caring for others  PARTICIPATION LIMITATIONS: driving  PERSONAL FACTORS: 1 comorbidity: osteopenia  are also affecting patient's functional outcome.   REHAB POTENTIAL: Good  CLINICAL DECISION MAKING: Stable/uncomplicated  EVALUATION COMPLEXITY: Low   GOALS:   SHORT TERM GOALS: Target date: 09/16/2023   Pt will be independent and compliant with HEP for improved pain, strength, ROM, and  function.  Baseline: Goal status:  GOAL MET  10/08/2023  2.  Pt will report at least a 25% improvement in pain and sx's overall. Baseline:  Goal status:  GOAL MET  10/08/2023    LONG TERM GOALS: Target date: 11/19/2023   Pt will be able to lift grocery bags without significant pain and difficulty.  Baseline:   Goal status: GOAL MET  10/08/23  2.  Pt will demo improved L shoulder flexion, ER, and scaption strength to 5/5 MMT for improved tolerance with and performance of functional lifting and carrying.   Baseline:  Goal status: INITIAL  3.  Pt will be able to drive without increased pain.   Baseline:  Goal status:  GOAL MET  10/08/2023  4.  Pt will report improved ease and reduced pain with her normal functional reaching activities.  Baseline:  Goal status: GOAL MET   10/08/2023  5.  Pt will report at least a 70% improvement in pain and sx's overall.   Goal status:  PROGRESSING 10/08/23  6.  Pt will report her worst pain to be no > than 4/10.   Goal status:  INITIAL    PLAN:  PT FREQUENCY: 2x/wk  PT DURATION: 4-6 weeks  PLANNED INTERVENTIONS: Therapeutic exercises, Therapeutic activity, Neuromuscular re-education, Patient/Family education, Self Care, Aquatic Therapy, Dry Needling, Electrical stimulation, Cryotherapy, Moist heat, Taping, Ultrasound, Manual therapy, and Re-evaluation  PLAN FOR NEXT SESSION: Cont with shoulder and scapular strengthening and stabilization with precautions concerning posterosuperior labral tear.  Cont with STW.   Aaron Edelman, PT 11/01/2023, 3:52 PM

## 2023-11-02 ENCOUNTER — Encounter (HOSPITAL_BASED_OUTPATIENT_CLINIC_OR_DEPARTMENT_OTHER): Payer: Self-pay | Admitting: Physical Therapy

## 2023-11-03 ENCOUNTER — Ambulatory Visit (HOSPITAL_BASED_OUTPATIENT_CLINIC_OR_DEPARTMENT_OTHER): Payer: Medicare PPO | Admitting: Physical Therapy

## 2023-11-03 DIAGNOSIS — M6281 Muscle weakness (generalized): Secondary | ICD-10-CM | POA: Diagnosis not present

## 2023-11-03 DIAGNOSIS — M25612 Stiffness of left shoulder, not elsewhere classified: Secondary | ICD-10-CM

## 2023-11-03 DIAGNOSIS — M25512 Pain in left shoulder: Secondary | ICD-10-CM

## 2023-11-03 NOTE — Therapy (Signed)
OUTPATIENT PHYSICAL THERAPY TREATMENT      Patient Name: Sandra Mora MRN: [id number] DOB:[birth year], 67 y.o., female Today's Date: 11/04/2023  END OF SESSION:   PT End of Session - 11/03/23        Visit Number 14     Number of Visits 18     Date for PT Re-Evaluation 11/19/23     Authorization Type [employer]     PT Start Time 1534     PT Stop Time 1617     PT Time Calculation (min) 43 min     Activity Tolerance Patient tolerated treatment well     Behavior During Therapy [facility] for tasks assessed/performed            Past Medical History:  Diagnosis Date   Allergy    seasonal   Migraine    Osteoporosis    Spine -3 (2013)   Past Surgical History:  Procedure Laterality Date   ABDOMINAL HYSTERECTOMY     COSMETIC SURGERY     Patient Active Problem List   Diagnosis Date Noted   Anxiety 06/28/2022   Family history of embolic stroke 06/28/2022   PDA (patent ductus arteriosus) 06/28/2022   Gastroesophageal reflux disease 01/15/2021   Encounter for counseling 01/15/2021   Osteoporosis    Allergic rhinitis 08/06/2014   Physical exam, annual 07/27/2012   Migraine      REFERRING PROVIDER: Jones Broom, MD  REFERRING DIAG: Left shoulder cuff tendonosis stiffness  THERAPY DIAG:  Left shoulder pain, unspecified chronicity  Muscle weakness (generalized)  Stiffness of left shoulder, not elsewhere classified  Rationale for Evaluation and Treatment: Rehabilitation  ONSET DATE: Exacerbation in March 2024  SUBJECTIVE:                                                                                                                                                                                      SUBJECTIVE STATEMENT: Patient denies any adverse effects after prior Rx.  Pt states she wants to push it a little more today to see how her UT responds.     Hand dominance: Right  PERTINENT HISTORY: MRI findings of posterosuperior labral tear Osteoporosis which  has improved to osteopenia  Migraines due to environmental/bariatric pressures changes per pt  PAIN:  NPRS:  shoulder .5/10 at rest and 1/10 with movement  NPRS: .5/10 L side of neck at rest and 1/10 with movement  PRECAUTIONS: Other: per MRI findings of posterosuperior labral tear, osteopenia   WEIGHT BEARING RESTRICTIONS: No  FALLS:  Has patient fallen in last 6 months? No  LIVING ENVIRONMENT: Lives with: lives with their spouse   OCCUPATION: Pt is a retired  OT.  PLOF: Independent.  Pt has chronic shoulder pain.   PATIENT GOALS:  to be pain free, full function, full ROM   OBJECTIVE:   DIAGNOSTIC FINDINGS:  MRI per (MD note): -Prior cuff repair.  Moderate distal supraspinatus and infraspinatus tendinosis with bursal side fraying.  No high grade or retracted cuff tear -mild subacromial subdeltoid bursitis -Mild GH and AC joint OA -posterosuperior labral tear through the labral base -mild intra-articular long head biceps tendinosis     TODAY'S TREATMENT:                                                                                                                                           Manual Therapy:  STM with TPR to L infraspinatus and teres minor seated and static and dynamic cupping to L UT seated to improve pain and soft tissue tightness/mobility and reduce myofascial adhesions and restrictions.    Therapeutic Exercise: UBE x 4 mins lvl 1 (2.5 min fwd/2.5 min bwd) Standing cable row 10# 3x10 Shoulder cable shoulder extension 7.5#  2x10, 10# 1x10 Prone hz abd with 1# 2x10 Prone extension 2# 3x10  Neuro Re-ed activities: shoulder ABC with p-ball on wall x 1 rep  4D ball rolls 2x10 each cw, ccw, up/down, s/s Supine PNF D2 rhythmic stab's 2x10     PATIENT EDUCATION: Education details: dx, relevant anatomy, POC, exercise form, HEP, and rationale of exercises.   Person educated: Patient Education method: Explanation, Demonstration, Verbal  cues Education comprehension: verbalized understanding, returned demonstration, verbal cues required, and needs further education  HOME EXERCISE PROGRAM: Access Code: [email or web address] URL: [email or web address] Date: 08/27/2023 Prepared by: Aaron Edelman   ASSESSMENT:  CLINICAL IMPRESSION: Patient is progressing well in PT.  She is improving with scapular strength and stability.  Pt performed exercises well without c/o's.  She has tightness and tenderness in UT though is improving.  Pt had an appropriate response to cupping.  Pt has a trigger point in infraspinatus and has tenderness with STM and TPR.  Pt responded well to Rx having no c/o's after Rx.  She states she was able to don jacket much easier after Rx.     OBJECTIVE IMPAIRMENTS: decreased ROM, decreased strength, hypomobility, impaired flexibility, impaired UE functional use, and pain.   ACTIVITY LIMITATIONS: carrying, lifting, sleeping, dressing, and caring for others  PARTICIPATION LIMITATIONS: driving  PERSONAL FACTORS: 1 comorbidity: osteopenia  are also affecting patient's functional outcome.   REHAB POTENTIAL: Good  CLINICAL DECISION MAKING: Stable/uncomplicated  EVALUATION COMPLEXITY: Low   GOALS:   SHORT TERM GOALS: Target date: 09/16/2023   Pt will be independent and compliant with HEP for improved pain, strength, ROM, and function.  Baseline: Goal status:  GOAL MET  10/08/2023  2.  Pt will report at least a 25% improvement in pain and sx's overall. Baseline:  Goal status:  GOAL MET  10/08/2023  LONG TERM GOALS: Target date: 11/19/2023   Pt will be able to lift grocery bags without significant pain and difficulty.  Baseline:  Goal status: GOAL MET  10/08/23  2.  Pt will demo improved L shoulder flexion, ER, and scaption strength to 5/5 MMT for improved tolerance with and performance of functional lifting and carrying.   Baseline:  Goal status: INITIAL  3.  Pt will be able to drive  without increased pain.   Baseline:  Goal status:  GOAL MET  10/08/2023  4.  Pt will report improved ease and reduced pain with her normal functional reaching activities.  Baseline:  Goal status: GOAL MET   10/08/2023  5.  Pt will report at least a 70% improvement in pain and sx's overall.   Goal status:  PROGRESSING 10/08/23  6.  Pt will report her worst pain to be no > than 4/10.   Goal status:  INITIAL    PLAN:  PT FREQUENCY: 2x/wk  PT DURATION: 4-6 weeks  PLANNED INTERVENTIONS: Therapeutic exercises, Therapeutic activity, Neuromuscular re-education, Patient/Family education, Self Care, Aquatic Therapy, Dry Needling, Electrical stimulation, Cryotherapy, Moist heat, Taping, Ultrasound, Manual therapy, and Re-evaluation  PLAN FOR NEXT SESSION: Cont with shoulder and scapular strengthening and stabilization with precautions concerning posterosuperior labral tear.  Cont with STW.   Audie Clear III PT, DPT 11/04/23 11:57 PM

## 2023-11-04 ENCOUNTER — Encounter (HOSPITAL_BASED_OUTPATIENT_CLINIC_OR_DEPARTMENT_OTHER): Payer: Self-pay | Admitting: Physical Therapy

## 2023-11-04 DIAGNOSIS — E7800 Pure hypercholesterolemia, unspecified: Secondary | ICD-10-CM | POA: Diagnosis not present

## 2023-11-04 DIAGNOSIS — M5414 Radiculopathy, thoracic region: Secondary | ICD-10-CM | POA: Diagnosis not present

## 2023-11-04 DIAGNOSIS — M9902 Segmental and somatic dysfunction of thoracic region: Secondary | ICD-10-CM | POA: Diagnosis not present

## 2023-11-04 DIAGNOSIS — Z6825 Body mass index (BMI) 25.0-25.9, adult: Secondary | ICD-10-CM | POA: Diagnosis not present

## 2023-11-04 DIAGNOSIS — F419 Anxiety disorder, unspecified: Secondary | ICD-10-CM | POA: Diagnosis not present

## 2023-11-08 ENCOUNTER — Ambulatory Visit (HOSPITAL_BASED_OUTPATIENT_CLINIC_OR_DEPARTMENT_OTHER): Payer: Medicare PPO | Admitting: Physical Therapy

## 2023-11-08 DIAGNOSIS — M25512 Pain in left shoulder: Secondary | ICD-10-CM | POA: Diagnosis not present

## 2023-11-08 DIAGNOSIS — M6281 Muscle weakness (generalized): Secondary | ICD-10-CM | POA: Diagnosis not present

## 2023-11-08 DIAGNOSIS — M25612 Stiffness of left shoulder, not elsewhere classified: Secondary | ICD-10-CM | POA: Diagnosis not present

## 2023-11-08 NOTE — Therapy (Signed)
OUTPATIENT PHYSICAL THERAPY TREATMENT      Patient Name: Sandra Mora MRN: [id number] DOB:[birth year], 67 y.o., female Today's Date: 11/09/2023  END OF SESSION:  PT End of Session - 11/08/23 1427     Visit Number 15    Number of Visits 18    Date for PT Re-Evaluation 11/19/23    Authorization Type [employer]    PT Start Time 1407    PT Stop Time 1452    PT Time Calculation (min) 45 min    Activity Tolerance Patient tolerated treatment well    Behavior During Therapy [facility] for tasks assessed/performed                    Past Medical History:  Diagnosis Date   Allergy    seasonal   Migraine    Osteoporosis    Spine -3 (2013)   Past Surgical History:  Procedure Laterality Date   ABDOMINAL HYSTERECTOMY     COSMETIC SURGERY     Patient Active Problem List   Diagnosis Date Noted   Anxiety 06/28/2022   Family history of embolic stroke 06/28/2022   PDA (patent ductus arteriosus) 06/28/2022   Gastroesophageal reflux disease 01/15/2021   Encounter for counseling 01/15/2021   Osteoporosis    Allergic rhinitis 08/06/2014   Physical exam, annual 07/27/2012   Migraine      REFERRING PROVIDER: Jones Broom, MD  REFERRING DIAG: Left shoulder cuff tendonosis stiffness  THERAPY DIAG:  Left shoulder pain, unspecified chronicity  Muscle weakness (generalized)  Stiffness of left shoulder, not elsewhere classified  Rationale for Evaluation and Treatment: Rehabilitation  ONSET DATE: Exacerbation in March 2024  SUBJECTIVE:                                                                                                                                                                                      SUBJECTIVE STATEMENT: Patient denies any adverse effects after prior Rx.  Pt states she took advil about 3 hours ago.  Pt states she picked up a heavy box and had some short head biceps pain.  Pt massaged her biceps and states it feels better now.  Pt states her  shoulder feels better than it has in a year.    Hand dominance: Right  PERTINENT HISTORY: MRI findings of posterosuperior labral tear Osteoporosis which has improved to osteopenia  Migraines due to environmental/bariatric pressures changes per pt  PAIN:  NPRS:  shoulder  no pain at rest and a little pain with movement  NPRS: .5/10 L side of neck at rest and 1/10 with movement  PRECAUTIONS: Other: per MRI findings of posterosuperior labral tear, osteopenia  WEIGHT BEARING RESTRICTIONS: No  FALLS:  Has patient fallen in last 6 months? No  LIVING ENVIRONMENT: Lives with: lives with their spouse   OCCUPATION: Pt is a retired Pharmacist, community.  PLOF: Independent.  Pt has chronic shoulder pain.   PATIENT GOALS:  to be pain free, full function, full ROM   OBJECTIVE:   DIAGNOSTIC FINDINGS:  MRI per (MD note): -Prior cuff repair.  Moderate distal supraspinatus and infraspinatus tendinosis with bursal side fraying.  No high grade or retracted cuff tear -mild subacromial subdeltoid bursitis -Mild GH and AC joint OA -posterosuperior labral tear through the labral base -mild intra-articular long head biceps tendinosis     TODAY'S TREATMENT:                                                                                                                                           Manual Therapy:  STM with TPR to L infraspinatus, teres minor, rhomboids seated and static cupping to L UT seated to improve pain and soft tissue tightness/mobility and reduce myofascial adhesions and restrictions.    Therapeutic Exercise: UBE x 5 mins lvl 2 (2.5 min fwd/2.5 min bwd) Standing cable row 10# 3x10  (setting 14 and 15) Shoulder cable shoulder extension 10# 3x10 Prone hz abd with 1# 2x10 Prone extension 2# 3x10  shoulder ABC with p-ball on wall x 1 rep with 1.1# ball 4D ball rolls 2x10 each cw, ccw, up/down, s/s with 1.1# ball     PATIENT EDUCATION: Education details: dx, relevant anatomy, POC,  exercise form, HEP, and rationale of exercises.   Person educated: Patient Education method: Explanation, Demonstration, Verbal cues Education comprehension: verbalized understanding, returned demonstration, verbal cues required, and needs further education  HOME EXERCISE PROGRAM: Access Code: [email or web address] URL: [email or web address] Date: 08/27/2023 Prepared by: Aaron Edelman   ASSESSMENT:  CLINICAL IMPRESSION: Patient continues to progress well in PT and states her shoulder is feeling better currently than it has in over a year.  She is improving with scapular strength and stability.  PT increased intensity of ball exercises on the wall with adding a weighted ball.  She has tightness in UT though is improving.  Pt had an appropriate response to cupping.  Pt has a trigger point in infraspinatus and has tenderness with STM and TPR.  Pt reports tightness in rhomboids and PT performed STM to rhomboids as well.  Pt responded well to Rx having no c/o's after Rx.      OBJECTIVE IMPAIRMENTS: decreased ROM, decreased strength, hypomobility, impaired flexibility, impaired UE functional use, and pain.   ACTIVITY LIMITATIONS: carrying, lifting, sleeping, dressing, and caring for others  PARTICIPATION LIMITATIONS: driving  PERSONAL FACTORS: 1 comorbidity: osteopenia  are also affecting patient's functional outcome.   REHAB POTENTIAL: Good  CLINICAL DECISION MAKING: Stable/uncomplicated  EVALUATION COMPLEXITY: Low   GOALS:   SHORT TERM GOALS: Target date: 09/16/2023   Pt  will be independent and compliant with HEP for improved pain, strength, ROM, and function.  Baseline: Goal status:  GOAL MET  10/08/2023  2.  Pt will report at least a 25% improvement in pain and sx's overall. Baseline:  Goal status:  GOAL MET  10/08/2023    LONG TERM GOALS: Target date: 11/19/2023   Pt will be able to lift grocery bags without significant pain and difficulty.  Baseline:  Goal  status: GOAL MET  10/08/23  2.  Pt will demo improved L shoulder flexion, ER, and scaption strength to 5/5 MMT for improved tolerance with and performance of functional lifting and carrying.   Baseline:  Goal status: INITIAL  3.  Pt will be able to drive without increased pain.   Baseline:  Goal status:  GOAL MET  10/08/2023  4.  Pt will report improved ease and reduced pain with her normal functional reaching activities.  Baseline:  Goal status: GOAL MET   10/08/2023  5.  Pt will report at least a 70% improvement in pain and sx's overall.   Goal status:  PROGRESSING 10/08/23  6.  Pt will report her worst pain to be no > than 4/10.   Goal status:  INITIAL    PLAN:  PT FREQUENCY: 2x/wk  PT DURATION: 4-6 weeks  PLANNED INTERVENTIONS: Therapeutic exercises, Therapeutic activity, Neuromuscular re-education, Patient/Family education, Self Care, Aquatic Therapy, Dry Needling, Electrical stimulation, Cryotherapy, Moist heat, Taping, Ultrasound, Manual therapy, and Re-evaluation  PLAN FOR NEXT SESSION: Cont with shoulder and scapular strengthening and stabilization with precautions concerning posterosuperior labral tear.  Cont with STW.   Audie Clear III PT, DPT 11/09/23 9:47 PM

## 2023-11-09 ENCOUNTER — Encounter (HOSPITAL_BASED_OUTPATIENT_CLINIC_OR_DEPARTMENT_OTHER): Payer: Self-pay | Admitting: Physical Therapy

## 2023-11-10 ENCOUNTER — Ambulatory Visit (HOSPITAL_BASED_OUTPATIENT_CLINIC_OR_DEPARTMENT_OTHER): Payer: Medicare PPO | Admitting: Physical Therapy

## 2023-11-10 ENCOUNTER — Encounter (HOSPITAL_BASED_OUTPATIENT_CLINIC_OR_DEPARTMENT_OTHER): Payer: Self-pay | Admitting: Physical Therapy

## 2023-11-10 DIAGNOSIS — M25612 Stiffness of left shoulder, not elsewhere classified: Secondary | ICD-10-CM

## 2023-11-10 DIAGNOSIS — E7800 Pure hypercholesterolemia, unspecified: Secondary | ICD-10-CM | POA: Diagnosis not present

## 2023-11-10 DIAGNOSIS — M25512 Pain in left shoulder: Secondary | ICD-10-CM | POA: Diagnosis not present

## 2023-11-10 DIAGNOSIS — M6281 Muscle weakness (generalized): Secondary | ICD-10-CM

## 2023-11-10 DIAGNOSIS — Z6824 Body mass index (BMI) 24.0-24.9, adult: Secondary | ICD-10-CM | POA: Diagnosis not present

## 2023-11-10 NOTE — Therapy (Incomplete)
OUTPATIENT PHYSICAL THERAPY TREATMENT      Patient Name: Sandra Mora MRN: [id number] DOB:[birth year], 67 y.o., female Today's Date: 11/11/2023  END OF SESSION:  PT End of Session - 11/10/23 1539     Visit Number 16    Number of Visits 18    Date for PT Re-Evaluation 11/19/23    Authorization Type [employer]    PT Start Time 1537    PT Stop Time 1620    PT Time Calculation (min) 43 min    Activity Tolerance Patient tolerated treatment well    Behavior During Therapy [facility] for tasks assessed/performed                    Past Medical History:  Diagnosis Date   Allergy    seasonal   Migraine    Osteoporosis    Spine -3 (2013)   Past Surgical History:  Procedure Laterality Date   ABDOMINAL HYSTERECTOMY     COSMETIC SURGERY     Patient Active Problem List   Diagnosis Date Noted   Anxiety 06/28/2022   Family history of embolic stroke 06/28/2022   PDA (patent ductus arteriosus) 06/28/2022   Gastroesophageal reflux disease 01/15/2021   Encounter for counseling 01/15/2021   Osteoporosis    Allergic rhinitis 08/06/2014   Physical exam, annual 07/27/2012   Migraine      REFERRING PROVIDER: Jones Broom, MD  REFERRING DIAG: Left shoulder cuff tendonosis stiffness  THERAPY DIAG:  Left shoulder pain, unspecified chronicity  Muscle weakness (generalized)  Stiffness of left shoulder, not elsewhere classified  Rationale for Evaluation and Treatment: Rehabilitation  ONSET DATE: Exacerbation in March 2024  SUBJECTIVE:                                                                                                                                                                                      SUBJECTIVE STATEMENT: Patient reports having a little soreness after prior Rx though no increased pain.  Pt states she performed the seated row, seated tricep, and lat pulldown machine in the gym.  Pt states her shoulder is feeling better.  Pt states she feels that  she is having tightness a little higher in her cervical.      Hand dominance: Right  PERTINENT HISTORY: MRI findings of posterosuperior labral tear Osteoporosis which has improved to osteopenia  Migraines due to environmental/bariatric pressures changes per pt  PAIN:  NPRS:  shoulder no pain at rest and a 1/10 pain reaching her head  NPRS: cervical no pain at rest  PRECAUTIONS: Other: per MRI findings of posterosuperior labral tear, osteopenia   WEIGHT BEARING RESTRICTIONS: No  FALLS:  Has patient fallen in last 6 months? No  LIVING ENVIRONMENT: Lives with: lives with their spouse   OCCUPATION: Pt is a retired Pharmacist, community.  PLOF: Independent.  Pt has chronic shoulder pain.   PATIENT GOALS:  to be pain free, full function, full ROM   OBJECTIVE:   DIAGNOSTIC FINDINGS:  MRI per (MD note): -Prior cuff repair.  Moderate distal supraspinatus and infraspinatus tendinosis with bursal side fraying.  No high grade or retracted cuff tear -mild subacromial subdeltoid bursitis -Mild GH and AC joint OA -posterosuperior labral tear through the labral base -mild intra-articular long head biceps tendinosis     TODAY'S TREATMENT:                                                                                                                                           Manual Therapy:  STM with TPR to L infraspinatus, teres minor, rhomboids seated and static cupping to L UT seated to improve pain and soft tissue tightness/mobility and reduce myofascial adhesions and restrictions.    Therapeutic Exercise: UBE x 5 mins lvl 2 (2.5 min fwd/2.5 min bwd) Seated row machine 25# 2x10 PT demonstrated a different way to perform triceps extension including at the cables instead of the seated triceps press machine she had been doing.  Standing cable triceps extension promoted better positioning for shoulder than the seated machine.  Pt performed standing cable triceps extension with 10# with good form  without pain.  Standing cable shoulder extension 10# 3x10 Prone hz abd with 1# 3x10  shoulder ABC with p-ball on wall x 1 rep with 1.1# ball 4D ball rolls 2x10 each cw, ccw, up/down, s/s with 1.1# ball     PATIENT EDUCATION: Education details: dx, relevant anatomy, POC/discharge planning, exercise form, HEP, and rationale of exercises.   Person educated: Patient Education method: Explanation, Demonstration, Verbal cues Education comprehension: verbalized understanding, returned demonstration, verbal cues required, and needs further education  HOME EXERCISE PROGRAM: Access Code: [email or web address] URL: [email or web address] Date: 08/27/2023 Prepared by: Aaron Edelman   ASSESSMENT:  CLINICAL IMPRESSION: Patient is making good progress with her pain, sx's, and strength.  She performed exercises well and is improving with scapular/shoulder stabilization.  Pt has improved soft tissue tightness in UT as evidenced by palpation and decreased response with cupping.  Pt responded well to Rx stating she felt better after STM and cupping and had no c/o's after Rx.     OBJECTIVE IMPAIRMENTS: decreased ROM, decreased strength, hypomobility, impaired flexibility, impaired UE functional use, and pain.   ACTIVITY LIMITATIONS: carrying, lifting, sleeping, dressing, and caring for others  PARTICIPATION LIMITATIONS: driving  PERSONAL FACTORS: 1 comorbidity: osteopenia  are also affecting patient's functional outcome.   REHAB POTENTIAL: Good  CLINICAL DECISION MAKING: Stable/uncomplicated  EVALUATION COMPLEXITY: Low   GOALS:   SHORT TERM GOALS: Target date: 09/16/2023   Pt will be independent and compliant with  HEP for improved pain, strength, ROM, and function.  Baseline: Goal status:  GOAL MET  10/08/2023  2.  Pt will report at least a 25% improvement in pain and sx's overall. Baseline:  Goal status:  GOAL MET  10/08/2023    LONG TERM GOALS: Target date:  11/19/2023   Pt will be able to lift grocery bags without significant pain and difficulty.  Baseline:  Goal status: GOAL MET  10/08/23  2.  Pt will demo improved L shoulder flexion, ER, and scaption strength to 5/5 MMT for improved tolerance with and performance of functional lifting and carrying.   Baseline:  Goal status: INITIAL  3.  Pt will be able to drive without increased pain.   Baseline:  Goal status:  GOAL MET  10/08/2023  4.  Pt will report improved ease and reduced pain with her normal functional reaching activities.  Baseline:  Goal status: GOAL MET   10/08/2023  5.  Pt will report at least a 70% improvement in pain and sx's overall.   Goal status:  PROGRESSING 10/08/23  6.  Pt will report her worst pain to be no > than 4/10.   Goal status:  INITIAL    PLAN:  PT FREQUENCY: 2x/wk  PT DURATION: 4-6 weeks  PLANNED INTERVENTIONS: Therapeutic exercises, Therapeutic activity, Neuromuscular re-education, Patient/Family education, Self Care, Aquatic Therapy, Dry Needling, Electrical stimulation, Cryotherapy, Moist heat, Taping, Ultrasound, Manual therapy, and Re-evaluation  PLAN FOR NEXT SESSION: Cont with shoulder and scapular strengthening and stabilization with precautions concerning posterosuperior labral tear.  Cont with STW. Possible discharge in 2 visits.  Audie Clear III PT, DPT 11/11/23 4:47 PM

## 2023-11-15 ENCOUNTER — Ambulatory Visit (HOSPITAL_BASED_OUTPATIENT_CLINIC_OR_DEPARTMENT_OTHER): Payer: Medicare PPO | Admitting: Physical Therapy

## 2023-11-15 DIAGNOSIS — M6281 Muscle weakness (generalized): Secondary | ICD-10-CM | POA: Diagnosis not present

## 2023-11-15 DIAGNOSIS — M25512 Pain in left shoulder: Secondary | ICD-10-CM | POA: Diagnosis not present

## 2023-11-15 DIAGNOSIS — M25612 Stiffness of left shoulder, not elsewhere classified: Secondary | ICD-10-CM | POA: Diagnosis not present

## 2023-11-15 NOTE — Therapy (Signed)
OUTPATIENT PHYSICAL THERAPY TREATMENT      Patient Name: Sandra Mora MRN: [id number] DOB:08/06/1956, 67 y.o., female Today's Date: 11/16/2023  END OF SESSION:  PT End of Session - 11/15/23 1720     Visit Number 17    Number of Visits 18    Date for PT Re-Evaluation 11/19/23    Authorization Type [employer]    PT Start Time 1623    PT Stop Time 1706    PT Time Calculation (min) 43 min    Activity Tolerance Patient tolerated treatment well    Behavior During Therapy [facility] for tasks assessed/performed                     Past Medical History:  Diagnosis Date   Allergy    seasonal   Migraine    Osteoporosis    Spine -3 (2013)   Past Surgical History:  Procedure Laterality Date   ABDOMINAL HYSTERECTOMY     COSMETIC SURGERY     Patient Active Problem List   Diagnosis Date Noted   Anxiety 06/28/2022   Family history of embolic stroke 06/28/2022   PDA (patent ductus arteriosus) 06/28/2022   Gastroesophageal reflux disease 01/15/2021   Encounter for counseling 01/15/2021   Osteoporosis    Allergic rhinitis 08/06/2014   Physical exam, annual 07/27/2012   Migraine      REFERRING PROVIDER: Jones Broom, MD  REFERRING DIAG: Left shoulder cuff tendonosis stiffness  THERAPY DIAG:  Left shoulder pain, unspecified chronicity  Muscle weakness (generalized)  Stiffness of left shoulder, not elsewhere classified  Rationale for Evaluation and Treatment: Rehabilitation  ONSET DATE: Exacerbation in March 2024  SUBJECTIVE:                                                                                                                                                                                      SUBJECTIVE STATEMENT: Patient states she was pretty sore the following day after prior Rx.  Pt used Advil which resolved the soreness.  Pt lifted a gallon of tea onto an eye-level shelf without pain with L UE.  Pt performed her theraband and prone scapular  exercises     Hand dominance: Right  PERTINENT HISTORY: MRI findings of posterosuperior labral tear Osteoporosis which has improved to osteopenia  Migraines due to environmental/bariatric pressures changes per pt  PAIN:  NPRS:  shoulder no pain at rest and a 1/10 pain reaching her head  NPRS: cervical no pain at rest  PRECAUTIONS: Other: per MRI findings of posterosuperior labral tear, osteopenia   WEIGHT BEARING RESTRICTIONS: No  FALLS:  Has patient fallen in last 6 months? No  LIVING ENVIRONMENT: Lives with: lives with their spouse   OCCUPATION: Pt is a retired Pharmacist, community.  PLOF: Independent.  Pt has chronic shoulder pain.   PATIENT GOALS:  to be pain free, full function, full ROM   OBJECTIVE:   DIAGNOSTIC FINDINGS:  MRI per (MD note): -Prior cuff repair.  Moderate distal supraspinatus and infraspinatus tendinosis with bursal side fraying.  No high grade or retracted cuff tear -mild subacromial subdeltoid bursitis -Mild GH and AC joint OA -posterosuperior labral tear through the labral base -mild intra-articular long head biceps tendinosis     TODAY'S TREATMENT:                                                                                                                                           Manual Therapy:  STM with TPR to L infraspinatus, teres minor, teres major seated and static cupping to L UT seated to improve pain and soft tissue tightness/mobility and reduce myofascial adhesions and restrictions.    Therapeutic Exercise: UBE x 5 mins lvl 2 (2.5 min fwd/2.5 min bwd) Seated row machine 25# 3x10 Standing cable shoulder extension 10# 3x10   shoulder ABC with p-ball on wall x 1 rep with 1.1# ball 4D ball rolls 2x10 each cw, ccw, up/down, s/s with 1.1# ball Standing ABC with 1# x 1 rep   PATIENT EDUCATION: Education details: dx, relevant anatomy, POC/discharge planning, exercise form, HEP, and rationale of exercises.   Person educated:  Patient Education method: Explanation, Demonstration, Verbal cues Education comprehension: verbalized understanding, returned demonstration, verbal cues required, and needs further education  HOME EXERCISE PROGRAM: Access Code: [email or web address] URL: [email or web address] Date: 08/27/2023 Prepared by: Aaron Edelman   ASSESSMENT:  CLINICAL IMPRESSION: Patient has made good progress in pain, sx's, and strength.  Pt has improved tolerance with lifting as evidenced by subjective reports.  She performed exercises well and has good tolerance with exercises.  Pt has improved soft tissue tightness in UT.  She did have tenderness and soft tissue tightness in teres which improved with soft tissue mobilization.  Pt responded well to Rx stating she felt better after STM and cupping.  Pt may be ready for discharge next Rx.     OBJECTIVE IMPAIRMENTS: decreased ROM, decreased strength, hypomobility, impaired flexibility, impaired UE functional use, and pain.   ACTIVITY LIMITATIONS: carrying, lifting, sleeping, dressing, and caring for others  PARTICIPATION LIMITATIONS: driving  PERSONAL FACTORS: 1 comorbidity: osteopenia  are also affecting patient's functional outcome.   REHAB POTENTIAL: Good  CLINICAL DECISION MAKING: Stable/uncomplicated  EVALUATION COMPLEXITY: Low   GOALS:   SHORT TERM GOALS: Target date: 09/16/2023   Pt will be independent and compliant with HEP for improved pain, strength, ROM, and function.  Baseline: Goal status:  GOAL MET  10/08/2023  2.  Pt will report at least a 25% improvement in pain and sx's overall. Baseline:  Goal status:  GOAL  MET  10/08/2023    LONG TERM GOALS: Target date: 11/19/2023   Pt will be able to lift grocery bags without significant pain and difficulty.  Baseline:  Goal status: GOAL MET  10/08/23  2.  Pt will demo improved L shoulder flexion, ER, and scaption strength to 5/5 MMT for improved tolerance with and performance of  functional lifting and carrying.   Baseline:  Goal status: INITIAL  3.  Pt will be able to drive without increased pain.   Baseline:  Goal status:  GOAL MET  10/08/2023  4.  Pt will report improved ease and reduced pain with her normal functional reaching activities.  Baseline:  Goal status: GOAL MET   10/08/2023  5.  Pt will report at least a 70% improvement in pain and sx's overall.   Goal status:  PROGRESSING 10/08/23  6.  Pt will report her worst pain to be no > than 4/10.   Goal status:  INITIAL    PLAN:  PT FREQUENCY: 2x/wk  PT DURATION: 4-6 weeks  PLANNED INTERVENTIONS: Therapeutic exercises, Therapeutic activity, Neuromuscular re-education, Patient/Family education, Self Care, Aquatic Therapy, Dry Needling, Electrical stimulation, Cryotherapy, Moist heat, Taping, Ultrasound, Manual therapy, and Re-evaluation  PLAN FOR NEXT SESSION: Cont with shoulder and scapular strengthening and stabilization with precautions concerning posterosuperior labral tear.  Cont with STW. Plan for discharge next treatment.  Audie Clear III PT, DPT 11/16/23 9:54 PM

## 2023-11-16 ENCOUNTER — Encounter (HOSPITAL_BASED_OUTPATIENT_CLINIC_OR_DEPARTMENT_OTHER): Payer: Self-pay | Admitting: Physical Therapy

## 2023-11-18 ENCOUNTER — Ambulatory Visit (HOSPITAL_BASED_OUTPATIENT_CLINIC_OR_DEPARTMENT_OTHER): Payer: Medicare PPO | Admitting: Physical Therapy

## 2023-11-18 DIAGNOSIS — M25612 Stiffness of left shoulder, not elsewhere classified: Secondary | ICD-10-CM | POA: Diagnosis not present

## 2023-11-18 DIAGNOSIS — M6281 Muscle weakness (generalized): Secondary | ICD-10-CM

## 2023-11-18 DIAGNOSIS — M25512 Pain in left shoulder: Secondary | ICD-10-CM | POA: Diagnosis not present

## 2023-11-18 NOTE — Therapy (Signed)
OUTPATIENT PHYSICAL THERAPY TREATMENT / PT DISCHARGE     Patient Name: Sandra Mora MRN: [id number] DOB:09/12/1956, 67 y.o., female Today's Date: 11/19/2023  END OF SESSION:  PT End of Session - 11/18/23 1417     Visit Number 18    Number of Visits 18    Date for PT Re-Evaluation 11/19/23    Authorization Type [employer]    PT Start Time 1409    PT Stop Time 1459    PT Time Calculation (min) 50 min    Activity Tolerance Patient tolerated treatment well    Behavior During Therapy [facility] for tasks assessed/performed                     Past Medical History:  Diagnosis Date   Allergy    seasonal   Migraine    Osteoporosis    Spine -3 (2013)   Past Surgical History:  Procedure Laterality Date   ABDOMINAL HYSTERECTOMY     COSMETIC SURGERY     Patient Active Problem List   Diagnosis Date Noted   Anxiety 06/28/2022   Family history of embolic stroke 06/28/2022   PDA (patent ductus arteriosus) 06/28/2022   Gastroesophageal reflux disease 01/15/2021   Encounter for counseling 01/15/2021   Osteoporosis    Allergic rhinitis 08/06/2014   Physical exam, annual 07/27/2012   Migraine      REFERRING PROVIDER: Jones Broom, MD  REFERRING DIAG: Left shoulder cuff tendonosis stiffness  THERAPY DIAG:  Left shoulder pain, unspecified chronicity  Muscle weakness (generalized)  Stiffness of left shoulder, not elsewhere classified  Rationale for Evaluation and Treatment: Rehabilitation  ONSET DATE: Exacerbation in March 2024  SUBJECTIVE:                                                                                                                                                                                      SUBJECTIVE STATEMENT: Patient has pain with reaching out laterally at end range.  Pt denies any adverse effects after prior Rx.  Pt used advil after Rx.  Pt is able to sleep on L side.  Pt reports compliance with HEP.  Pt hasn't performed standing hz  abd with GTB at home.    Hand dominance: Right  PERTINENT HISTORY: MRI findings of posterosuperior labral tear Osteoporosis which has improved to osteopenia  Migraines due to environmental/bariatric pressures changes per pt  PAIN:  NPRS:  shoulder no pain at rest, 3/10 worst pain NPRS: cervical .5/10 pain at rest  PRECAUTIONS: Other: per MRI findings of posterosuperior labral tear, osteopenia   WEIGHT BEARING RESTRICTIONS: No  FALLS:  Has patient fallen in last 6 months?  No  LIVING ENVIRONMENT: Lives with: lives with their spouse   OCCUPATION: Pt is a retired Pharmacist, community.  PLOF: Independent.  Pt has chronic shoulder pain.   PATIENT GOALS:  to be pain free, full function, full ROM   OBJECTIVE:   DIAGNOSTIC FINDINGS:  MRI per (MD note): -Prior cuff repair.  Moderate distal supraspinatus and infraspinatus tendinosis with bursal side fraying.  No high grade or retracted cuff tear -mild subacromial subdeltoid bursitis -Mild GH and AC joint OA -posterosuperior labral tear through the labral base -mild intra-articular long head biceps tendinosis     TODAY'S TREATMENT:                                                                                                                                          UPPER EXTREMITY MMT:   MMT Right eval Left eval Left 11/15 Left 12/26  Shoulder flexion 5/5 4+/5 4+/5 5/5  Shoulder scaption 5/5 4/5 4/5 4+/5  Shoulder abduction 5/5 weak 4-/5 through available ROM 4/5  Shoulder adduction         Shoulder internal rotation 5/5 5/5     Shoulder external rotation 5/5 4+/5 4+/5 4+/5  Middle trapezius         Lower trapezius         Elbow flexion 5/5 4+/5 5/5   Elbow extension         Wrist flexion         Wrist extension         Wrist ulnar deviation         Wrist radial deviation         Wrist pronation         Wrist supination         Grip strength (lbs)         (Blank rows = not tested)   Therapeutic Exercise: UBE x 5 mins  lvl 2 (2.5 min fwd/2.5 min bwd) Standing hz abd with GTB 2x10 Prone hz abd with 1# x10 Standing cable shoulder extension 10# 3x10  Neuro Re-ed Activities: 4D ball rolls 2x10 each cw, ccw, up/down, s/s with 1.1# ball Standing ABC with 1# x 1 rep    PT spent extensive time going through HEP and educating pt concerning correct exercises and appropriate frequency, progression, and  resistance.  PT gave pt a HEP handout.    FOTO:  Prior/current:  65 / 72. Goal of 68.   PATIENT EDUCATION: Education details: dx, relevant anatomy, POC/discharge planning, exercise form, HEP, and rationale of exercises.   Person educated: Patient Education method: Explanation, Demonstration, Verbal cues Education comprehension: verbalized understanding, returned demonstration, verbal cues required, and needs further education  HOME EXERCISE PROGRAM: Access Code: [email or web address] URL: [email or web address] Date: 08/27/2023 Prepared by: Aaron Edelman  Updated HEP: - Standing Shoulder Horizontal Abduction with Resistance  - 1 x daily - 4 x weekly - 2-3 sets -  10 reps - Standing Shoulder Row with Anchored Resistance  - 1 x daily - 5 x weekly - 2 sets - 10 reps - Shoulder extension with resistance - Neutral  - 1 x daily - 5 x weekly - 2 sets - 10 reps or standing cable column shoulder extension 2x/wk - Standing Wall [employer] with Mini Swiss Ball  - 1 x daily - 4 x weekly - 2 sets - 10 reps - Shoulder Alphabet with Dumbbell  - 1 x daily - 3- 4 x weekly - 1 reps  ASSESSMENT:  CLINICAL IMPRESSION: Patient has made great progress in function, pain, and sx's.  She has improved tolerance with activity and has improved functional lifting.  Pt has responded well to strengthening, stabilization, and soft tissue work.  Pt has improved soft tissue tightness in UT.  Pt demonstrates improved L shoulder flexion, abd, and scaption strength though no change in ER strength.  PT spent extensive time today finalizing  her HEP.  PT educated pt in correct exercises and appropriate frequency and progression.  PT also educated pt in how to incorporate gym exercises and the appropriate ratio/frequency of home exercises when performing gym exercises.  Pt received an advanced HEP handout and is independent with HEP/gym program.  Pt has met all STG's LTG's #1,3,4,6.  PT didn't assess LTG #5.  Pt demonstrates improved self perceived disability with FOTO score improving from 65 to 72.  She met her FOTO goal.  Pt is ready for discharge.     OBJECTIVE IMPAIRMENTS: decreased ROM, decreased strength, hypomobility, impaired flexibility, impaired UE functional use, and pain.   ACTIVITY LIMITATIONS: carrying, lifting, sleeping, dressing, and caring for others  PARTICIPATION LIMITATIONS: driving  PERSONAL FACTORS: 1 comorbidity: osteopenia  are also affecting patient's functional outcome.   REHAB POTENTIAL: Good  CLINICAL DECISION MAKING: Stable/uncomplicated  EVALUATION COMPLEXITY: Low   GOALS:   SHORT TERM GOALS: Target date: 09/16/2023   Pt will be independent and compliant with HEP for improved pain, strength, ROM, and function.  Baseline: Goal status:  GOAL MET  10/08/2023  2.  Pt will report at least a 25% improvement in pain and sx's overall. Baseline:  Goal status:  GOAL MET  10/08/2023    LONG TERM GOALS: Target date: 11/19/2023   Pt will be able to lift grocery bags without significant pain and difficulty.  Baseline:  Goal status: GOAL MET  10/08/23  2.  Pt will demo improved L shoulder flexion, ER, and scaption strength to 5/5 MMT for improved tolerance with and performance of functional lifting and carrying.   Baseline:  Goal status: 33% MET  12/26  3.  Pt will be able to drive without increased pain.   Baseline:  Goal status:  GOAL MET  10/08/2023  4.  Pt will report improved ease and reduced pain with her normal functional reaching activities.  Baseline:  Goal status: GOAL MET    10/08/2023  5.  Pt will report at least a 70% improvement in pain and sx's overall.   Goal status:  PROGRESSING 10/08/23.  Not assessed today.  6.  Pt will report her worst pain to be no > than 4/10.   Goal status:  GOAL MET   12/26    PLAN:   PLANNED INTERVENTIONS: Therapeutic exercises, Therapeutic activity, Neuromuscular re-education, Patient/Family education, Self Care, Aquatic Therapy, Dry Needling, Electrical stimulation, Cryotherapy, Moist heat, Taping, Ultrasound, Manual therapy, and Re-evaluation  PLAN FOR NEXT SESSION: Pt to be discharged from skilled  PT services due to meeting the majority of her goals and being pleased with her current functional level.  Pt is independent with HEP/gym program and will cont with HEP/gym program.  Pt is agreeable with discharge.   PHYSICAL THERAPY DISCHARGE SUMMARY  Visits from Start of Care: 18  Current functional level related to goals / functional outcomes: See above   Remaining deficits: See above   Education / Equipment:  HEP   Audie Clear III PT, DPT 11/19/23 3:16 PM

## 2023-11-19 ENCOUNTER — Encounter (HOSPITAL_BASED_OUTPATIENT_CLINIC_OR_DEPARTMENT_OTHER): Payer: Self-pay | Admitting: Physical Therapy

## 2023-11-22 ENCOUNTER — Encounter (HOSPITAL_BASED_OUTPATIENT_CLINIC_OR_DEPARTMENT_OTHER): Payer: Medicare PPO | Admitting: Physical Therapy

## 2023-11-23 DIAGNOSIS — E7800 Pure hypercholesterolemia, unspecified: Secondary | ICD-10-CM | POA: Diagnosis not present

## 2023-11-23 DIAGNOSIS — Z6824 Body mass index (BMI) 24.0-24.9, adult: Secondary | ICD-10-CM | POA: Diagnosis not present

## 2023-11-25 ENCOUNTER — Encounter (HOSPITAL_BASED_OUTPATIENT_CLINIC_OR_DEPARTMENT_OTHER): Payer: Medicare PPO | Admitting: Physical Therapy

## 2023-12-01 ENCOUNTER — Ambulatory Visit: Payer: Medicare PPO

## 2023-12-02 ENCOUNTER — Ambulatory Visit: Payer: Medicare PPO

## 2023-12-02 DIAGNOSIS — M5414 Radiculopathy, thoracic region: Secondary | ICD-10-CM | POA: Diagnosis not present

## 2023-12-02 DIAGNOSIS — M9902 Segmental and somatic dysfunction of thoracic region: Secondary | ICD-10-CM | POA: Diagnosis not present

## 2023-12-03 ENCOUNTER — Telehealth: Payer: Self-pay

## 2023-12-03 ENCOUNTER — Other Ambulatory Visit (HOSPITAL_COMMUNITY): Payer: Self-pay

## 2023-12-03 ENCOUNTER — Ambulatory Visit (INDEPENDENT_AMBULATORY_CARE_PROVIDER_SITE_OTHER): Payer: Medicare PPO | Admitting: Family Medicine

## 2023-12-03 DIAGNOSIS — M810 Age-related osteoporosis without current pathological fracture: Secondary | ICD-10-CM

## 2023-12-03 MED ORDER — DENOSUMAB 60 MG/ML ~~LOC~~ SOSY
60.0000 mg | PREFILLED_SYRINGE | Freq: Once | SUBCUTANEOUS | Status: AC
  Administered 2024-06-02: 60 mg via SUBCUTANEOUS

## 2023-12-03 NOTE — Progress Notes (Signed)
 Sandra Mora is a 68 y.o. female presents to the office today for prolia injection per physician's orders. Injection was administered Subcutaneous Left arm.   Patient's next injection due 6 month, appt made? yes  [name]

## 2023-12-03 NOTE — Telephone Encounter (Signed)
 Pt ready for scheduling for PROLIA  on or after : 12/03/23  Out-of-pocket cost due at time of visit: $80  Number of injection/visits approved: 2  Primary: [employer] Prolia  co-insurance: $40 Admin fee co-insurance: $40  Secondary: --- Prolia  co-insurance:  Admin fee co-insurance:   Medical Benefit Details: Date Benefits were checked: 11/24/23 Deductible: NO/ Coinsurance: $40/ Admin Fee: $40  Prior Auth: APPROVED PA# [id number] Expiration Date: 11/24/23-11/22/24   # of doses approved: 2  Pharmacy benefit: Copay $64 If patient wants fill through the pharmacy benefit please send prescription to: [employer], and include estimated need by date in rx notes. Pharmacy will ship medication directly to the office.  Patient NOT eligible for Prolia  Copay Card. Copay Card can make patient's cost as little as $25. Link to apply: [email or web address]  ** This summary of benefits is an estimation of the patient's out-of-pocket cost. Exact cost may very based on individual plan coverage.

## 2023-12-03 NOTE — Telephone Encounter (Signed)
 Marland Kitchen

## 2023-12-10 DIAGNOSIS — H40013 Open angle with borderline findings, low risk, bilateral: Secondary | ICD-10-CM | POA: Diagnosis not present

## 2023-12-15 DIAGNOSIS — Z87898 Personal history of other specified conditions: Secondary | ICD-10-CM | POA: Diagnosis not present

## 2023-12-15 DIAGNOSIS — E7800 Pure hypercholesterolemia, unspecified: Secondary | ICD-10-CM | POA: Diagnosis not present

## 2023-12-29 DIAGNOSIS — Z6824 Body mass index (BMI) 24.0-24.9, adult: Secondary | ICD-10-CM | POA: Diagnosis not present

## 2023-12-29 DIAGNOSIS — Z01419 Encounter for gynecological examination (general) (routine) without abnormal findings: Secondary | ICD-10-CM | POA: Diagnosis not present

## 2023-12-29 DIAGNOSIS — Z1231 Encounter for screening mammogram for malignant neoplasm of breast: Secondary | ICD-10-CM | POA: Diagnosis not present

## 2023-12-29 LAB — HM MAMMOGRAPHY

## 2024-01-12 DIAGNOSIS — Z87898 Personal history of other specified conditions: Secondary | ICD-10-CM | POA: Diagnosis not present

## 2024-01-12 DIAGNOSIS — E7800 Pure hypercholesterolemia, unspecified: Secondary | ICD-10-CM | POA: Diagnosis not present

## 2024-01-17 ENCOUNTER — Other Ambulatory Visit: Payer: Self-pay | Admitting: Family Medicine

## 2024-02-13 ENCOUNTER — Other Ambulatory Visit: Payer: Self-pay | Admitting: Family Medicine

## 2024-03-20 ENCOUNTER — Other Ambulatory Visit: Payer: Self-pay | Admitting: Family Medicine

## 2024-05-25 ENCOUNTER — Telehealth: Payer: Self-pay

## 2024-05-25 NOTE — Telephone Encounter (Signed)
 Surgical Clearance is scheduled for 7/10 at 40 mins Called [facility] in [place] to get clearance request They will fax to us  on Monday 7/7. Will F/U if it has not been received by then [phone]

## 2024-05-25 NOTE — Telephone Encounter (Signed)
 Admin team, please call this patient and schedule her a surgical clearance appointment. We cannot do this as a nurse visit and that is all that is scheduled for the 11th for her to have the Prolia .

## 2024-05-25 NOTE — Telephone Encounter (Signed)
 Copied from CRM [id number]. Topic: Appointments - Scheduling Inquiry for Clinic >> May 25, 2024  8:59 AM Revonda D wrote: Reason for CRM: Pt has an appt scheduled on 7/11 for a prolia  shot and would like to get an EKG completed on that appt as well if possible. Pt stated that she has a procedure coming up in August and they are requesting that she gets an EKG done. Pt would like a callback with an update regarding this request.

## 2024-05-30 ENCOUNTER — Telehealth: Payer: Self-pay

## 2024-05-30 NOTE — Telephone Encounter (Signed)
 Type of form received: surgical clearance  Additional comments:    Received by: fax  Form should be Faxed/mailed to: [phone]  Is patient requesting call for pickup: no  Form placed:  PCP folder at front office needs an appointment to be scheduled   Attach charge sheet.  Provider will determine charge.  Individual made aware of 3-5 business day turn around No?

## 2024-05-30 NOTE — Telephone Encounter (Signed)
 Surgical clearance with Dr Jerrell on the 21st of July

## 2024-05-31 NOTE — Telephone Encounter (Signed)
 Placed form in Dr.Vincent office

## 2024-06-01 ENCOUNTER — Ambulatory Visit: Admitting: Family Medicine

## 2024-06-02 ENCOUNTER — Ambulatory Visit: Payer: Medicare PPO | Admitting: Family Medicine

## 2024-06-02 DIAGNOSIS — M810 Age-related osteoporosis without current pathological fracture: Secondary | ICD-10-CM

## 2024-06-02 MED ORDER — DENOSUMAB 60 MG/ML ~~LOC~~ SOSY
60.0000 mg | PREFILLED_SYRINGE | Freq: Once | SUBCUTANEOUS | Status: AC
  Administered 2024-11-29: 60 mg via SUBCUTANEOUS

## 2024-06-02 NOTE — Progress Notes (Addendum)
 Sandra Mora is a 68 y.o. female presents to the office today for Dr.Tabori per physician's orders. Injection was administered Subcutaneous Left arm.   Patient's next injection due 6 month, appt made? yes  Bascom GORMAN Collet

## 2024-06-12 ENCOUNTER — Ambulatory Visit: Admitting: Student in an Organized Health Care Education/Training Program

## 2024-06-12 ENCOUNTER — Encounter: Payer: Self-pay | Admitting: Student in an Organized Health Care Education/Training Program

## 2024-06-12 VITALS — BP 122/70 | HR 100 | Ht 62.0 in | Wt 133.0 lb

## 2024-06-12 DIAGNOSIS — Z01810 Encounter for preprocedural cardiovascular examination: Secondary | ICD-10-CM | POA: Insufficient documentation

## 2024-06-12 NOTE — Patient Instructions (Signed)
  VISIT SUMMARY: Today, you came in for an EKG as part of your preparation for your upcoming plastic surgery for ear correction and neck tightening. You have completed all other pre-operative requirements and need the EKG report sent to your surgical team.  YOUR PLAN: -PREOPERATIVE EVALUATION FOR PLASTIC SURGERY: You are scheduled for plastic surgery under general anesthesia next Friday. Since you have experienced nausea with anesthesia in the past, we will provide preventive treatment to manage this. We will perform the EKG today and send the report to Dr. Dannette office.  INSTRUCTIONS: We will send your EKG report to Dr. Dannette office. Please follow up with your surgical team for any further instructions or requirements before your procedure next Friday.

## 2024-06-12 NOTE — Progress Notes (Signed)
   Acute Office Visit  Subjective:     Patient ID: Sandra Mora, female    DOB: Apr 09, 1956, 68 y.o.   MRN: [id number]  Chief Complaint  Patient presents with   Pre-op Exam    Patient states surgical clearance needs and EKG.   [facility] in [place]    HPI  Discussed the use of AI scribe software for clinical note transcription with the patient, who gave verbal consent to proceed.  History of Present Illness Sandra Mora is a 68 year old female who presents for an EKG in preparation for plastic surgery.  She is scheduled for plastic surgery for ear correction and neck tightening next Friday in [place]. An EKG is required as part of her surgical preparation. She has completed all other pre-operative questionnaires and needs the EKG report sent to the surgical team.  She has a history of general anesthesia approximately three to four years ago, with nausea as the only noted reaction. She expects to be discharged on the same day as the procedure.  Her overall health is reported as very good. No history of myocardial infarction, cerebrovascular accident, recent thromboembolic events, or recent hospitalizations. No significant changes in her health or medications recently.  She is currently on Prolia  injections every six months for osteoporosis, which is now showing no signs of the condition.  In terms of physical activity, she exercises regularly at [facility], engaging in weight machine exercises and 30 minutes on the treadmill with an elevation of three. She aims for 10,000 steps daily. No chest pain or shortness of breath during exercise.      Objective:    BP 122/70   Pulse 100   Ht 5' 2 (1.575 m)   Wt 133 lb (60.3 kg)   SpO2 98%   BMI 24.33 kg/m    Physical Exam  Gen: Well-appearing woman Heart: Regular, no murmur Lungs: Unlabored, clear throughout Ext: Warm, no edema, normal joints   EKG: Obtained for perioperative cardiovascular risk assessment.  EKG  is mild sinus tachycardia, normal axis, normal intervals, no ST changes or pathologic Q waves.      Assessment & Plan:   Problem List Items Addressed This Visit       Unprioritized   Pre-operative cardiovascular examination - Primary   She is scheduled for plastic surgery under general anesthesia.  This is a low risk superficial procedure.  There is a history of nausea with anesthesia, so prophylactic treatment is planned.  She has no cardiovascular risk, no angina.  Greater than 4 metabolic equivalents.  Charlanne perioperative risk score is 0.  No high risk hematologic history.  No high risk pulmonary history.  Acceptable risk from my standpoint to go forward with this low risk procedure.  Patient is medically well optimized.  No changes will be needed in her usual medications around the time of the procedure.      Relevant Orders   EKG 12-Lead   Basic metabolic panel with GFR    Cleatus Debby Specking, MD

## 2024-06-12 NOTE — Assessment & Plan Note (Signed)
 She is scheduled for plastic surgery under general anesthesia.  This is a low risk superficial procedure.  There is a history of nausea with anesthesia, so prophylactic treatment is planned.  She has no cardiovascular risk, no angina.  Greater than 4 metabolic equivalents.  Charlanne perioperative risk score is 0.  No high risk hematologic history.  No high risk pulmonary history.  Acceptable risk from my standpoint to go forward with this low risk procedure.  Patient is medically well optimized.  No changes will be needed in her usual medications around the time of the procedure.

## 2024-06-12 NOTE — Telephone Encounter (Signed)
 Faxed all forms to [facility]

## 2024-06-12 NOTE — Telephone Encounter (Signed)
 Do you still have the forms for this Patient?

## 2024-06-12 NOTE — Telephone Encounter (Signed)
 Done

## 2024-06-13 ENCOUNTER — Ambulatory Visit: Payer: Self-pay | Admitting: Student in an Organized Health Care Education/Training Program

## 2024-06-13 LAB — BASIC METABOLIC PANEL WITH GFR
BUN: 20 mg/dL (ref 6–23)
CO2: 34 meq/L — ABNORMAL HIGH (ref 19–32)
Calcium: 9.9 mg/dL (ref 8.4–10.5)
Chloride: 101 meq/L (ref 96–112)
Creatinine, Ser: 0.85 mg/dL (ref 0.40–1.20)
GFR: 70.69 mL/min (ref >60.00)
Glucose, Bld: 87 mg/dL (ref 70–99)
Potassium: 4.5 meq/L (ref 3.5–5.1)
Sodium: 140 meq/L (ref 135–145)

## 2024-06-14 NOTE — Telephone Encounter (Signed)
 [facility] did receive the EKG. Called and verified with front desk over at the place.

## 2024-06-20 ENCOUNTER — Other Ambulatory Visit: Payer: Self-pay | Admitting: Family Medicine

## 2024-08-02 ENCOUNTER — Other Ambulatory Visit: Payer: Self-pay | Admitting: Family Medicine

## 2024-08-02 MED ORDER — SUMATRIPTAN SUCCINATE 25 MG PO TABS: 25.0000 mg | ORAL_TABLET | ORAL | 1 refills | Status: AC | PRN

## 2024-08-02 NOTE — Telephone Encounter (Signed)
 FYI Only or Action Required?: Action required by provider: medication refill request.  Patient was last seen in primary care on 06/12/2024 by Jerrell Cleatus Ned, MD.  Called Nurse Triage reporting No chief complaint on file..  Symptoms began today.  Interventions attempted: Nothing.  Symptoms are: stable.  Triage Disposition: No disposition on file.  Patient/caregiver understands and will follow disposition?:

## 2024-08-02 NOTE — Telephone Encounter (Signed)
 Copied from CRM [id number]. Topic: Clinical - Medication Refill >> Aug 02, 2024  8:54 AM Aleatha C wrote: Medication: SUMAtriptan  (IMITREX ) 25 MG tablet     Has the patient contacted their pharmacy? No (Agent: If no, request that the patient contact the pharmacy for the refill. If patient does not wish to contact the pharmacy document the reason why and proceed with request.) (Agent: If yes, when and what did the pharmacy advise?)  This is the patient's preferred pharmacy:  [facility] [id number] [place], [place] - [address] [place] [address] Phone: [phone] Fax: [phone]   Is this the correct pharmacy for this prescription? Yes If no, delete pharmacy and type the correct one.   Has the prescription been filled recently? No  Is the patient out of the medication? No have 6 left  Has the patient been seen for an appointment in the last year OR does the patient have an upcoming appointment? Yes  Can we respond through MyChart? No  Agent: Please be advised that Rx refills may take up to 3 business days. We ask that you follow-up with your pharmacy.

## 2024-08-09 ENCOUNTER — Ambulatory Visit (INDEPENDENT_AMBULATORY_CARE_PROVIDER_SITE_OTHER)

## 2024-08-09 ENCOUNTER — Telehealth: Payer: Self-pay | Admitting: Family Medicine

## 2024-08-09 VITALS — Ht 62.0 in | Wt 133.0 lb

## 2024-08-09 DIAGNOSIS — Z0000 Encounter for general adult medical examination without abnormal findings: Secondary | ICD-10-CM | POA: Diagnosis not present

## 2024-08-09 NOTE — Telephone Encounter (Signed)
 Patient called to schedule Prolia  injection I see the verification from 11/24/23 but we may need to verify again because it doesn't specify a date range for the approval It also looks like she will have a $40 copay unless this has changed. Can we send this to the pharmacy team to run through [employer] please?

## 2024-08-09 NOTE — Progress Notes (Signed)
 Subjective:   Sandra Mora is a 68 y.o. who presents for a Medicare Wellness preventive visit.  As a reminder, Annual Wellness Visits don't include a physical exam, and some assessments may be limited, especially if this visit is performed virtually. We may recommend an in-person follow-up visit with your provider if needed.  Visit Complete: Virtual I connected with  Inocente JAYSON Gainer on 08/09/24 by a audio enabled telemedicine application and verified that I am speaking with the correct person using two identifiers.  Patient Location: Home  Provider Location: Home Office  I discussed the limitations of evaluation and management by telemedicine. The patient expressed understanding and agreed to proceed.  Vital Signs: Because this visit was a virtual/telehealth visit, some criteria may be missing or patient reported. Any vitals not documented were not able to be obtained and vitals that have been documented are patient reported.  VideoDeclined- This patient declined Librarian, academic. Therefore the visit was completed with audio only.  Persons Participating in Visit: Patient.  AWV Questionnaire: No: Patient Medicare AWV questionnaire was not completed prior to this visit.  Cardiac Risk Factors include: advanced age (>66men, >24 women);Other (see comment), Risk factor comments: PDA     Objective:    Today's Vitals   08/09/24 0812  Weight: 133 lb (60.3 kg)  Height: 5' 2 (1.575 m)   Body mass index is 24.33 kg/m.     08/09/2024    8:23 AM 08/26/2023   10:02 AM 08/04/2023    9:35 AM 08/20/2022    8:19 AM 10/28/2015    8:32 PM  Advanced Directives  Does Patient Have a Medical Advance Directive? Yes Yes Yes Yes No   Type of Estate agent of [place];Living will Healthcare Power of [place];Living will Healthcare Power of [employer] of [place];Living will   Copy of Healthcare Power of Attorney in Chart? No - copy  requested   No - copy requested      Data saved with a previous flowsheet row definition    Current Medications (verified) Outpatient Encounter Medications as of 08/09/2024  Medication Sig   buPROPion  (WELLBUTRIN ) 75 MG tablet TAKE 1 TABLET BY MOUTH TWICE A DAY   Calcium Carbonate-Vitamin D  (CALCIUM 600 + D PO) Take by mouth daily.   Cholecalciferol (VITAMIN D ) 2000 UNITS CAPS Take 4,000 Units by mouth daily.   denosumab  (PROLIA ) 60 MG/ML SOSY injection Inject 60 mg into the skin every 6 (six) months.   fluticasone (FLONASE) 50 MCG/ACT nasal spray Place into the nose.   magnesium oxide (MAG-OX) 400 MG tablet Take 400 mg by mouth daily.   Multiple Vitamin (MULTIVITAMIN) tablet Take 1 tablet by mouth daily.   Omega-3 Fatty Acids (FISH OIL PO) Take 2 Capfuls by mouth daily.   Probiotic Product (PROBIOTIC DAILY PO) Take by mouth.   pyridOXINE (VITAMIN B-6) 100 MG tablet Vitamin B6   SUMAtriptan  (IMITREX ) 25 MG tablet Take 1 tablet (25 mg total) by mouth every 2 (two) hours as needed for migraine. May repeat in 2 hours if headache persists or recurs.   Facility-Administered Encounter Medications as of 08/09/2024  Medication   denosumab  (PROLIA ) injection 60 mg    Allergies (verified) Codeine   History: Past Medical History:  Diagnosis Date   Allergy    seasonal   Migraine    Osteoporosis    Spine -3 (2013)   Past Surgical History:  Procedure Laterality Date   ABDOMINAL HYSTERECTOMY  COSMETIC SURGERY     Family History  Problem Relation Age of Onset   Heart disease Mother        Aortic valve defect   Anemia Mother        Chronic-S/P CA tax.   Arrhythmia Mother        A-Fib   Hyperlipidemia Mother    Hypertension Mother    Heart attack Father        At age 108   Heart disease Father        CHF-at age 4   Arrhythmia Sister    Stroke Sister    Arrhythmia Sister        A-Fib   Clotting disorder Sister        TIA's   Hypertension Sister    Social History    Socioeconomic History   Marital status: Married    Spouse name: Richard   Number of children: 2   Years of education: Not on file   Highest education level: Bachelor's degree (e.g., BA, AB, BS)  Occupational History   Occupation: RETIRED/occupational therapist    Employer: [employer]  Tobacco Use   Smoking status: Never   Smokeless tobacco: Never  Vaping Use   Vaping status: Never Used  Substance and Sexual Activity   Alcohol use: No   Drug use: No   Sexual activity: Not Currently  Other Topics Concern   Not on file  Social History Narrative   Lives with husband/2025   Social Drivers of Health   Financial Resource Strain: Low Risk  (08/09/2024)   Overall Financial Resource Strain (CARDIA)    Difficulty of Paying Living Expenses: Not hard at all  Food Insecurity: No Food Insecurity (08/09/2024)   Hunger Vital Sign    Worried About Running Out of Food in the Last Year: Never true    Ran Out of Food in the Last Year: Never true  Transportation Needs: No Transportation Needs (08/09/2024)   PRAPARE - Administrator, Civil Service (Medical): No    Lack of Transportation (Non-Medical): No  Physical Activity: Insufficiently Active (08/09/2024)   Exercise Vital Sign    Days of Exercise per Week: 2 days    Minutes of Exercise per Session: 60 min  Stress: No Stress Concern Present (08/09/2024)   [employer] of Occupational Health - Occupational Stress Questionnaire    Feeling of Stress: Not at all  Social Connections: Socially Integrated (08/09/2024)   Social Connection and Isolation Panel    Frequency of Communication with Friends and Family: More than three times a week    Frequency of Social Gatherings with Friends and Family: More than three times a week    Attends Religious Services: More than 4 times per year    Active Member of [employer] or Organizations: Yes    Attends Engineer, structural: More than 4 times per year    Marital Status:  Married    Tobacco Counseling Counseling given: Not Answered    Clinical Intake:  Pre-visit preparation completed: Yes  Pain : No/denies pain     BMI - recorded: 24.33 Nutritional Status: BMI of 19-24  Normal Nutritional Risks: None Diabetes: No  No results found for: HGBA1C   How often do you need to have someone help you when you read instructions, pamphlets, or other written materials from your doctor or pharmacy?: 1 - Never  Interpreter Needed?: No  Information entered by :: Vetra Shinall, RMA   Activities of  Daily Living     08/09/2024    8:20 AM  In your present state of health, do you have any difficulty performing the following activities:  Hearing? 0  Vision? 0  Difficulty concentrating or making decisions? 0  Walking or climbing stairs? 0  Dressing or bathing? 0  Doing errands, shopping? 0  Preparing Food and eating ? N  Using the Toilet? N  In the past six months, have you accidently leaked urine? N  Do you have problems with loss of bowel control? N  Managing your Medications? N  Managing your Finances? N  Housekeeping or managing your Housekeeping? N    Patient Care Team: Mahlon Comer BRAVO, MD as PCP - General (Family Medicine) [place], Amy, MD as Consulting Physician (Dermatology) Latisha Medford, MD as Consulting Physician (Obstetrics and Gynecology) Debarah Lorrene DEL., MD as Consulting Physician (Ophthalmology) Kristie Lamprey, MD as Consulting Physician (Gastroenterology)  I have updated your Care Teams any recent Medical Services you may have received from other providers in the past year.     Assessment:   This is a routine wellness examination for [place].  Hearing/Vision screen Hearing Screening - Comments:: Denies hearing difficulties   Vision Screening - Comments:: Wears contact lenses/Dr. Abigail   Goals Addressed   None    Depression Screen     08/09/2024    8:26 AM 08/04/2023    9:38 AM 08/20/2022    8:18 AM 08/03/2022   10:33  AM 06/25/2022    8:18 AM 08/21/2021    1:37 PM 09/16/2020    7:39 AM  PHQ 2/9 Scores  PHQ - 2 Score 0 0 0 0 0 0 0  PHQ- 9 Score 0 0 0 0 6 0 0    Fall Risk     08/09/2024    8:23 AM 08/04/2023    9:35 AM 07/31/2023    5:30 PM 08/20/2022    8:16 AM 08/03/2022   10:32 AM  Fall Risk   Falls in the past year? 0 0 0 0 0  Number falls in past yr: 0 0 0 0 0  Injury with Fall? 0 0 0 0 0  Risk for fall due to :    No Fall Risks No Fall Risks  Follow up Falls evaluation completed;Falls prevention discussed Falls evaluation completed;Education provided;Falls prevention discussed  Falls prevention discussed  Falls evaluation completed      Data saved with a previous flowsheet row definition    MEDICARE RISK AT HOME:  Medicare Risk at Home Any stairs in or around the home?: Yes (outside and inside of the home) If so, are there any without handrails?: No Home free of loose throw rugs in walkways, pet beds, electrical cords, etc?: Yes Adequate lighting in your home to reduce risk of falls?: Yes Life alert?: No Use of a cane, walker or w/c?: No Grab bars in the bathroom?: No Shower chair or bench in shower?: Yes Elevated toilet seat or a handicapped toilet?: Yes  TIMED UP AND GO:  Was the test performed?  No  Cognitive Function: Declined/Normal: No cognitive concerns noted by patient or family. Patient alert, oriented, able to answer questions appropriately and recall recent events. No signs of memory loss or confusion.        08/20/2022    8:20 AM  6CIT Screen  What Year? 0 points  What month? 0 points  What time? 0 points  Count back from 20 0 points  Months in reverse 0 points  Repeat phrase 2 points  Total Score 2 points    Immunizations Immunization History  Administered Date(s) Administered   Fluad Quad(high Dose 65+) 09/29/2022   Hep B, Unspecified 11/23/2000   Hepatitis A, Adult 10/29/2000   Influenza,inj,Quad PF,6+ Mos 09/22/2016, 10/04/2017, 09/12/2018, 09/16/2020    Influenza-Unspecified 08/23/2020, 09/23/2021   PFIZER(Purple Top)SARS-COV-2 Vaccination 01/26/2020, 02/16/2020   Tdap 10/26/2010, 08/23/2020, 09/16/2020   Unspecified SARS-COV-2 Vaccination 02/01/2019, 01/26/2020    Screening Tests Health Maintenance  Topic Date Due   Pneumococcal Vaccine: 50+ Years (1 of 1 - PCV) Never done   Zoster Vaccines- Shingrix (1 of 2) Never done   Mammogram  07/10/2022   Influenza Vaccine  06/23/2024   COVID-19 Vaccine (5 - 2025-26 season) 07/24/2024   Medicare Annual Wellness (AWV)  08/09/2025   Colonoscopy  06/27/2029   DTaP/Tdap/Td (4 - Td or Tdap) 09/16/2030   DEXA SCAN  Completed   Hepatitis C Screening  Completed   HPV VACCINES  Aged Out   Meningococcal B Vaccine  Aged Out    Health Maintenance Items Addressed: See Nurse Notes at the end of this note  Additional Screening:  Vision Screening: Recommended annual ophthalmology exams for early detection of glaucoma and other disorders of the eye. Is the patient up to date with their annual eye exam?  No  Who is the provider or what is the name of the office in which the patient attends annual eye exams? Dr. Ricarda  Dental Screening: Recommended annual dental exams for proper oral hygiene  Community Resource Referral / Chronic Care Management: CRR required this visit?  No   CCM required this visit?  No   Plan:    I have personally reviewed and noted the following in the patient's chart:   Medical and social history Use of alcohol, tobacco or illicit drugs  Current medications and supplements including opioid prescriptions. Patient is not currently taking opioid prescriptions. Functional ability and status Nutritional status Physical activity Advanced directives List of other physicians Hospitalizations, surgeries, and ER visits in previous 12 months Vitals Screenings to include cognitive, depression, and falls Referrals and appointments  In addition, I have reviewed and discussed with  patient certain preventive protocols, quality metrics, and best practice recommendations. A written personalized care plan for preventive services as well as general preventive health recommendations were provided to patient.   Bryndan Bilyk L Krystall Kruckenberg, CMA   08/09/2024   After Visit Summary: (MyChart) Due to this being a telephonic visit, the after visit summary with patients personalized plan was offered to patient via MyChart   Notes: Patient is due for a pneumonia and Flu vaccine.  She declines the Shingrix vaccine.  Patient stated that she has had a DEXA and a mammogram within the past year.  I have sent a request for records out today.  Patient stated that she will call the office to get scheduled for her Prolia  injection, as she does not know when was the last injection. She had no other concerns to address today.

## 2024-08-09 NOTE — Telephone Encounter (Addendum)
 Pulled up [employer] report showing verification date of 11/24/2023 but not providing a date range, called [employer] to verify date range, they report a coverage end date of 11/22/2024  Sandra Mora and verified office orders this on her behalf, Email sent to physician services requesting this be sent to the office

## 2024-08-09 NOTE — Patient Instructions (Signed)
 Ms. Sandra Mora,  Thank you for taking the time for your Medicare Wellness Visit. I appreciate your continued commitment to your health goals. Please review the care plan we discussed, and feel free to reach out if I can assist you further.  Medicare recommends these wellness visits once per year to help you and your care team stay ahead of potential health issues. These visits are designed to focus on prevention, allowing your provider to concentrate on managing your acute and chronic conditions during your regular appointments.  Please note that Annual Wellness Visits do not include a physical exam. Some assessments may be limited, especially if the visit was conducted virtually. If needed, we may recommend a separate in-person follow-up with your provider.  Ongoing Care Seeing your primary care provider every 3 to 6 months helps us  monitor your health and provide consistent, personalized care. Last office visit 06/12/2024.    Referrals If a referral was made during today's visit and you haven't received any updates within two weeks, please contact the referred provider directly to check on the status.  Recommended Screenings:  Health Maintenance  Topic Date Due   Pneumococcal Vaccine for age over 41 (1 of 1 - PCV) Never done   Zoster (Shingles) Vaccine (1 of 2) Never done   Breast Cancer Screening  07/10/2022   Flu Shot  06/23/2024   COVID-19 Vaccine (5 - 2025-26 season) 07/24/2024   Medicare Annual Wellness Visit  08/03/2024   Colon Cancer Screening  06/27/2029   DTaP/Tdap/Td vaccine (4 - Td or Tdap) 09/16/2030   DEXA scan (bone density measurement)  Completed   Hepatitis C Screening  Completed   HPV Vaccine  Aged Out   Meningitis B Vaccine  Aged Out       08/09/2024    8:23 AM  Advanced Directives  Does Patient Have a Medical Advance Directive? Yes  Type of Estate agent of [place];Living will  Copy of Healthcare Power of Attorney in Chart? No - copy requested    Advance Care Planning is important because it: Ensures you receive medical care that aligns with your values, goals, and preferences. Provides guidance to your family and loved ones, reducing the emotional burden of decision-making during critical moments.  Vision: Annual vision screenings are recommended for early detection of glaucoma, cataracts, and diabetic retinopathy. These exams can also reveal signs of chronic conditions such as diabetes and high blood pressure.  Dental: Annual dental screenings help detect early signs of oral cancer, gum disease, and other conditions linked to overall health, including heart disease and diabetes.  Please see the attached documents for additional preventive care recommendations.

## 2024-08-10 NOTE — Telephone Encounter (Signed)
 Prolia  injection arrived and placed in medication fridge.

## 2024-08-16 ENCOUNTER — Ambulatory Visit

## 2024-10-29 ENCOUNTER — Other Ambulatory Visit: Payer: Self-pay | Admitting: Family Medicine

## 2024-11-08 ENCOUNTER — Telehealth: Payer: Self-pay

## 2024-11-08 NOTE — Telephone Encounter (Signed)
 Copied from CRM #[id number]. Topic: Clinical - Prescription Issue >> Nov 08, 2024  2:47 PM Sasha M wrote: Reason for CRM: Pt has questions about her Prolia  injections due to insurance coverage and is not sure if you would have to order it before the end of the year. Next appt is 01/07.  Please call pt to advise at number on file.

## 2024-11-09 NOTE — Telephone Encounter (Signed)
 Called and confirmed with patient, notes she wanted to ensure the medication got ordered prior to her appointment on 11/29/2024 send a message to physician services requesting a dose for her.

## 2024-11-28 ENCOUNTER — Telehealth: Payer: Self-pay

## 2024-11-28 ENCOUNTER — Other Ambulatory Visit (HOSPITAL_COMMUNITY): Payer: Self-pay

## 2024-11-28 NOTE — Telephone Encounter (Signed)
 Prolia  VOB initiated via [email or web address]  Next Prolia  inj DUE: 12/03/24

## 2024-11-29 ENCOUNTER — Ambulatory Visit

## 2024-12-04 ENCOUNTER — Other Ambulatory Visit (HOSPITAL_COMMUNITY): Payer: Self-pay

## 2024-12-04 NOTE — Telephone Encounter (Signed)
" ° °  [employer] cannot give estimate, following Medicare Advantage guideline (20% coinsurance, 20% admin fee)   "

## 2024-12-04 NOTE — Telephone Encounter (Signed)
 SABRA

## 2024-12-04 NOTE — Telephone Encounter (Signed)
 Pt ready for scheduling for PROLIA  on or after : 12/04/24  Option# 1: Buy/Bill (Office supplied medication)  Out-of-pocket cost due at time of clinic visit: $377 - following Medicare Advantage guideline   Number of injection/visits approved: 2  Primary: [employer] Prolia  co-insurance: 20% Admin fee co-insurance: 20%  Secondary: --- Prolia  co-insurance:  Admin fee co-insurance:   Medical Benefit Details: Date Benefits were checked: 11/29/24 Deductible: NO/ Coinsurance: 20%/ Admin Fee: 20%  Prior Auth: APPROVED PA# [id number] Expiration Date: 11/23/24-11/22/25   # of doses approved: 2 ----------------------------------------------------------------------- Option# 2- Med Obtained from pharmacy:  Pharmacy benefit: Copay $64 (Paid to pharmacy) Admin Fee: 20% (Pay at clinic)  Prior Auth: N/A PA# Expiration Date:   # of doses approved:   If patient wants fill through the pharmacy benefit please send prescription to: [facility], and include estimated need by date in rx notes. Pharmacy will ship medication directly to the office.  Patient NOT eligible for Prolia  Copay Card. Copay Card can make patient's cost as little as $25. Link to apply: [email or web address]  ** This summary of benefits is an estimation of the patient's out-of-pocket cost. Exact cost may very based on individual plan coverage.

## 2025-05-31 ENCOUNTER — Ambulatory Visit: Admitting: Family Medicine
